# Patient Record
Sex: Female | Born: 1980 | Race: White | Hispanic: Yes | Marital: Single | Smoking: Light tobacco smoker
Health system: Southern US, Community
[De-identification: ages and names within clinical notes are randomized; demographics above are authoritative.]

## PROBLEM LIST (undated history)

## (undated) ENCOUNTER — Inpatient Hospital Stay (HOSPITAL_COMMUNITY): Payer: Self-pay

## (undated) DIAGNOSIS — B977 Papillomavirus as the cause of diseases classified elsewhere: Secondary | ICD-10-CM

## (undated) HISTORY — PX: INDUCED ABORTION: SHX677

---

## 2002-01-09 ENCOUNTER — Other Ambulatory Visit: Admission: RE | Admit: 2002-01-09 | Discharge: 2002-01-09 | Payer: Self-pay | Admitting: Obstetrics

## 2004-08-01 ENCOUNTER — Other Ambulatory Visit: Admission: RE | Admit: 2004-08-01 | Discharge: 2004-08-01 | Payer: Self-pay | Admitting: Internal Medicine

## 2010-08-18 ENCOUNTER — Inpatient Hospital Stay (HOSPITAL_COMMUNITY): Admission: AD | Admit: 2010-08-18 | Discharge: 2010-08-18 | Payer: Self-pay | Admitting: Obstetrics & Gynecology

## 2012-02-21 ENCOUNTER — Inpatient Hospital Stay (HOSPITAL_COMMUNITY): Payer: Medicaid Other

## 2012-02-21 ENCOUNTER — Inpatient Hospital Stay (HOSPITAL_COMMUNITY)
Admission: AD | Admit: 2012-02-21 | Discharge: 2012-02-21 | Disposition: A | Discharge status: Home / Self Care | Payer: Medicaid Other | Source: Ambulatory Visit | Attending: Obstetrics & Gynecology | Admitting: Obstetrics & Gynecology

## 2012-02-21 ENCOUNTER — Encounter (HOSPITAL_COMMUNITY): Payer: Self-pay | Admitting: *Deleted

## 2012-02-21 DIAGNOSIS — O209 Hemorrhage in early pregnancy, unspecified: Secondary | ICD-10-CM | POA: Insufficient documentation

## 2012-02-21 HISTORY — DX: Papillomavirus as the cause of diseases classified elsewhere: B97.7

## 2012-02-21 LAB — URINALYSIS, ROUTINE W REFLEX MICROSCOPIC
Bilirubin Urine: NEGATIVE
Ketones, ur: NEGATIVE mg/dL
Specific Gravity, Urine: 1.01 (ref 1.005–1.030)
Urobilinogen, UA: 0.2 mg/dL (ref 0.0–1.0)

## 2012-02-21 LAB — ABO/RH: ABO/RH(D): O POS

## 2012-02-21 LAB — URINE MICROSCOPIC-ADD ON

## 2012-02-21 LAB — WET PREP, GENITAL
Trich, Wet Prep: NONE SEEN
Yeast Wet Prep HPF POC: NONE SEEN

## 2012-02-21 NOTE — MAU Note (Signed)
Patient states she has had a positive pregnancy test at [facility]. States she had brownish spotting with wiping after urinating. States a little lower abdominal discomfort.

## 2012-02-21 NOTE — MAU Provider Note (Signed)
[name] [name] y.o.G2P0010 @[redacted]w[redacted]d  by LMP Chief Complaint  Patient presents with  . Vaginal Bleeding     First Provider Initiated Contact with Patient 02/21/12 1355      SUBJECTIVE  HPI: Noted dark brown streaking on toilet paper after wiping today. This is first episode of any vaginal bleeding. She did have intercourse yesterday. No prenatal care yet. Has been attempting to get pregnant for 1-1/2 years. No abdominal pain  OB Hx: early EAB  Past Medical History  Diagnosis Date  . HPV (human papilloma virus) infection    Past Surgical History  Procedure Date  . No past surgeries    History   Social History  . Marital Status: Single    Spouse Name: N/A    Number of Children: N/A  . Years of Education: N/A   Occupational History  . Not on file.   Social History Main Topics  . Smoking status: Former Games developer  . Smokeless tobacco: Not on file  . Alcohol Use: No  . Drug Use: No  . Sexually Active: Yes   Other Topics Concern  . Not on file   Social History Narrative  . No narrative on file   No current facility-administered medications on file prior to encounter.   No current outpatient prescriptions on file prior to encounter.   No Known Allergies  ROS: Pertinent items in HPI  OBJECTIVE Blood pressure 115/67, pulse 58, temperature 97.4 F (36.3 C), temperature source Oral, resp. rate 16, height 5\' 7"  (1.702 m), weight 98.521 kg (217 lb 3.2 oz), last menstrual period 12/12/2011, SpO2 100.00%.  GENERAL: Well-developed, well-nourished female in no acute distress.  HEENT: Normocephalic, good dentition HEART: normal rate RESP: normal effort ABDOMEN: Soft, nontender EXTREMITIES: Nontender, no edema NEURO: Alert and oriented SPECULUM EXAM: NEFG, scant brown blood noted blood noted without any active bleeding or discharge, cervix clean BIMANUAL: cervix long closed; uterus 8-10 wk size; no adnexal tenderness or masses   LAB RESULTS  Results for orders placed  during the hospital encounter of 02/21/12 (from the past 24 hour(s))  URINALYSIS, ROUTINE W REFLEX MICROSCOPIC     Status: Abnormal   Collection Time   02/21/12  1:00 PM      Component Value Range   Color, Urine YELLOW  YELLOW    APPearance CLEAR  CLEAR    Specific Gravity, Urine 1.010  1.005 - 1.030    pH 7.0  5.0 - 8.0    Glucose, UA NEGATIVE  NEGATIVE (mg/dL)   Hgb urine dipstick MODERATE (*) NEGATIVE    Bilirubin Urine NEGATIVE  NEGATIVE    Ketones, ur NEGATIVE  NEGATIVE (mg/dL)   Protein, ur NEGATIVE  NEGATIVE (mg/dL)   Urobilinogen, UA 0.2  0.0 - 1.0 (mg/dL)   Nitrite NEGATIVE  NEGATIVE    Leukocytes, UA NEGATIVE  NEGATIVE   URINE MICROSCOPIC-ADD ON     Status: Abnormal   Collection Time   02/21/12  1:00 PM      Component Value Range   Squamous Epithelial / LPF FEW (*) RARE    RBC / HPF 0-2  <3 (RBC/hpf)   Bacteria, UA RARE  RARE   WET PREP, GENITAL     Status: Abnormal   Collection Time   02/21/12  1:00 PM      Component Value Range   Yeast Wet Prep HPF POC NONE SEEN  NONE SEEN    Trich, Wet Prep NONE SEEN  NONE SEEN    Clue Cells Wet Prep  HPF POC NONE SEEN  NONE SEEN    WBC, Wet Prep HPF POC FEW (*) NONE SEEN   POCT PREGNANCY, URINE     Status: Abnormal   Collection Time   02/21/12  1:21 PM      Component Value Range   Preg Test, Ur POSITIVE (*) NEGATIVE   ABO/RH     Status: Normal   Collection Time   02/21/12  2:01 PM      Component Value Range   ABO/RH(D) O POS      IMAGING Bedside ultrasound by me confirms viable early IUP [place]: viable IUP 945d, plac and adnexae nl  ASSESSMENT Viable IUP Early pregnancy bleeding  PLAN   followup health Department. Prenatal vitamins 1 by mouth daily. Information on  bleeding in pregnancy and on pregnancy precautions.   Gaspar Fowle 02/21/2012 1:56 PM

## 2012-03-11 ENCOUNTER — Encounter (HOSPITAL_COMMUNITY): Payer: Self-pay | Admitting: *Deleted

## 2012-03-11 ENCOUNTER — Inpatient Hospital Stay (HOSPITAL_COMMUNITY)
Admission: AD | Admit: 2012-03-11 | Discharge: 2012-03-11 | Disposition: A | Discharge status: Home / Self Care | Payer: Medicaid Other | Source: Ambulatory Visit | Attending: Obstetrics & Gynecology | Admitting: Obstetrics & Gynecology

## 2012-03-11 DIAGNOSIS — O99891 Other specified diseases and conditions complicating pregnancy: Secondary | ICD-10-CM | POA: Insufficient documentation

## 2012-03-11 DIAGNOSIS — R109 Unspecified abdominal pain: Secondary | ICD-10-CM | POA: Insufficient documentation

## 2012-03-11 DIAGNOSIS — N949 Unspecified condition associated with female genital organs and menstrual cycle: Secondary | ICD-10-CM

## 2012-03-11 LAB — URINALYSIS, ROUTINE W REFLEX MICROSCOPIC
Glucose, UA: NEGATIVE mg/dL
Specific Gravity, Urine: >1.03 — ABNORMAL HIGH (ref 1.005–1.030)

## 2012-03-11 LAB — URINE MICROSCOPIC-ADD ON

## 2012-03-11 NOTE — MAU Provider Note (Signed)
[name] [name] y.o.G2P0010 @[redacted]w[redacted]d  by LMP Chief Complaint  Patient presents with  . Abdominal Pain     First Provider Initiated Contact with Patient 03/11/12 1715      SUBJECTIVE  HPI: Pt reports short, sharp groin pains w/ mvmt and loss of breast tenderness in the past few days. She denies LOF, vaginal discharge or VB. She has not started prenatal care yet due to waiting for Medicaid and is very nervous about not having start prenatal care yet. GC/CT, Wet prep neg two weeks ago in MAU.    Past Medical History  Diagnosis Date  . HPV (human papilloma virus) infection    Past Surgical History  Procedure Date  . Induced abortion    History   Social History  . Marital Status: Single    Spouse Name: N/A    Number of Children: N/A  . Years of Education: N/A   Occupational History  . Not on file.   Social History Main Topics  . Smoking status: Former Smoker    Types: Cigarettes  . Smokeless tobacco: Not on file   Comment: with preg  . Alcohol Use: Yes     none with preg  . Drug Use: No  . Sexually Active: Yes   Other Topics Concern  . Not on file   Social History Narrative  . No narrative on file   No current facility-administered medications on file prior to encounter.   Current Outpatient Prescriptions on File Prior to Encounter  Medication Sig Dispense Refill  . Prenatal Vit-Fe Fumarate-FA (PRENATAL MULTIVITAMIN) TABS Take 1 tablet by mouth daily.       No Known Allergies  ROS: Pertinent items in HPI  OBJECTIVE Blood pressure 112/64, pulse 69, temperature 99 F (37.2 C), temperature source Oral, resp. rate 20, height 5\' 7"  (1.702 m), weight 98.884 kg (218 lb), last menstrual period 12/12/2011, SpO2 100.00%.  GENERAL: Well-developed, well-nourished female in no acute distress. Nervous-appearing. HEENT: Normocephalic, good dentition HEART: normal rate RESP: normal effort ABDOMEN: Soft, nontender EXTREMITIES: Nontender, no edema NEURO: Alert and  oriented BIMANUAL: cervix long and closed; uterus NSSP; no adnexal tenderness or masses FHR: 158  LAB RESULTS Results for orders placed during the hospital encounter of 03/11/12 (from the past 24 hour(s))  URINALYSIS, ROUTINE W REFLEX MICROSCOPIC     Status: Abnormal   Collection Time   03/11/12  2:10 PM      Component Value Range   Color, Urine YELLOW  YELLOW    APPearance HAZY (*) CLEAR    Specific Gravity, Urine >1.030 (*) 1.005 - 1.030    pH 6.0  5.0 - 8.0    Glucose, UA NEGATIVE  NEGATIVE (mg/dL)   Hgb urine dipstick TRACE (*) NEGATIVE    Bilirubin Urine NEGATIVE  NEGATIVE    Ketones, ur NEGATIVE  NEGATIVE (mg/dL)   Protein, ur NEGATIVE  NEGATIVE (mg/dL)   Urobilinogen, UA 0.2  0.0 - 1.0 (mg/dL)   Nitrite NEGATIVE  NEGATIVE    Leukocytes, UA NEGATIVE  NEGATIVE   URINE MICROSCOPIC-ADD ON     Status: Abnormal   Collection Time   03/11/12  2:10 PM      Component Value Range   Squamous Epithelial / LPF MANY (*) RARE    RBC / HPF 0-2  <3 (RBC/hpf)   Bacteria, UA RARE  RARE    Crystals CA OXALATE CRYSTALS (*) NEGATIVE    Urine-Other MUCOUS PRESENT     IMAGING NA  Very reassured to hear  FHR and to know that cervix is closed.  ASSESSMENT 1. Round ligament pain    PLAN D/C home Follow-up Information    Please follow up. (start prenatal care)       Follow up with [facility]. (As needed if symptoms worsen)    Contact information:   [address] [place] [address] [phone]        Medication List  As of 03/11/2012  6:02 PM   CONTINUE taking these medications         prenatal multivitamin Tabs          Reassurance given Comfort measures   Dorathy Kinsman 03/11/2012 5:56 PM

## 2012-03-11 NOTE — MAU Note (Signed)
Pain started over the weekend, worse yesterday. No bleeding.

## 2012-03-11 NOTE — MAU Note (Signed)
Patient states she has been having lower abdominal pain a couple of times every 2-3 hours since yesterday. Denies any bleeding or vaginal discharge.

## 2012-03-11 NOTE — MAU Provider Note (Signed)
Attestation of Attending Supervision of Advanced Practitioner: Evaluation and management procedures were performed by the [facility] Fellow/PA/CNM/NP under my supervision and collaboration. Chart reviewed, and agree with management and plan.  Jaynie Collins, M.D. 03/11/2012 6:27 PM

## 2012-03-11 NOTE — MAU Note (Signed)
Breasts do not seem as tender.

## 2012-04-17 ENCOUNTER — Other Ambulatory Visit (HOSPITAL_COMMUNITY): Payer: Self-pay | Admitting: Family

## 2012-04-17 DIAGNOSIS — Z1389 Encounter for screening for other disorder: Secondary | ICD-10-CM

## 2012-05-01 ENCOUNTER — Ambulatory Visit (HOSPITAL_COMMUNITY)
Admission: RE | Admit: 2012-05-01 | Discharge: 2012-05-01 | Disposition: A | Discharge status: Home / Self Care | Payer: Medicaid Other | Source: Ambulatory Visit | Attending: Family | Admitting: Family

## 2012-05-01 DIAGNOSIS — Z363 Encounter for antenatal screening for malformations: Secondary | ICD-10-CM | POA: Insufficient documentation

## 2012-05-01 DIAGNOSIS — O0930 Supervision of pregnancy with insufficient antenatal care, unspecified trimester: Secondary | ICD-10-CM | POA: Insufficient documentation

## 2012-05-01 DIAGNOSIS — O358XX0 Maternal care for other (suspected) fetal abnormality and damage, not applicable or unspecified: Secondary | ICD-10-CM | POA: Insufficient documentation

## 2012-05-01 DIAGNOSIS — Z1389 Encounter for screening for other disorder: Secondary | ICD-10-CM | POA: Insufficient documentation

## 2012-05-23 ENCOUNTER — Inpatient Hospital Stay (HOSPITAL_COMMUNITY)
Admission: AD | Admit: 2012-05-23 | Discharge: 2012-05-27 | DRG: 765 | Disposition: A | Discharge status: Home / Self Care | Payer: Medicaid Other | Source: Ambulatory Visit | Attending: Obstetrics & Gynecology | Admitting: Obstetrics & Gynecology

## 2012-05-23 ENCOUNTER — Inpatient Hospital Stay (HOSPITAL_COMMUNITY): Payer: Medicaid Other

## 2012-05-23 ENCOUNTER — Encounter (HOSPITAL_COMMUNITY): Payer: Self-pay | Admitting: *Deleted

## 2012-05-23 DIAGNOSIS — O9081 Anemia of the puerperium: Secondary | ICD-10-CM | POA: Diagnosis not present

## 2012-05-23 DIAGNOSIS — O4590 Premature separation of placenta, unspecified, unspecified trimester: Principal | ICD-10-CM | POA: Diagnosis present

## 2012-05-23 DIAGNOSIS — N390 Urinary tract infection, site not specified: Secondary | ICD-10-CM | POA: Diagnosis present

## 2012-05-23 DIAGNOSIS — O2390 Unspecified genitourinary tract infection in pregnancy, unspecified trimester: Secondary | ICD-10-CM | POA: Diagnosis present

## 2012-05-23 DIAGNOSIS — O909 Complication of the puerperium, unspecified: Secondary | ICD-10-CM | POA: Diagnosis not present

## 2012-05-23 DIAGNOSIS — O9903 Anemia complicating the puerperium: Secondary | ICD-10-CM | POA: Diagnosis not present

## 2012-05-23 DIAGNOSIS — O321XX0 Maternal care for breech presentation, not applicable or unspecified: Secondary | ICD-10-CM | POA: Diagnosis present

## 2012-05-23 DIAGNOSIS — D62 Acute posthemorrhagic anemia: Secondary | ICD-10-CM | POA: Diagnosis not present

## 2012-05-23 DIAGNOSIS — O902 Hematoma of obstetric wound: Secondary | ICD-10-CM | POA: Diagnosis not present

## 2012-05-23 LAB — DIC (DISSEMINATED INTRAVASCULAR COAGULATION)PANEL
D-Dimer, Quant: 0.87 ug/mL-FEU — ABNORMAL HIGH (ref 0.00–0.48)
Fibrinogen: 677 mg/dL — ABNORMAL HIGH (ref 204–475)
Platelets: 233 10*3/uL (ref 150–400)
Smear Review: NONE SEEN

## 2012-05-23 LAB — URINALYSIS, ROUTINE W REFLEX MICROSCOPIC
Ketones, ur: NEGATIVE mg/dL
Nitrite: NEGATIVE
Protein, ur: NEGATIVE mg/dL

## 2012-05-23 LAB — CBC
HCT: 34.3 % — ABNORMAL LOW (ref 36.0–46.0)
MCV: 84.3 fL (ref 78.0–100.0)
Platelets: 271 10*3/uL (ref 150–400)
RBC: 4.07 MIL/uL (ref 3.87–5.11)
WBC: 17.4 10*3/uL — ABNORMAL HIGH (ref 4.0–10.5)

## 2012-05-23 LAB — URINE MICROSCOPIC-ADD ON

## 2012-05-23 LAB — RPR: RPR Ser Ql: NONREACTIVE

## 2012-05-23 MED ORDER — MAGNESIUM SULFATE BOLUS VIA INFUSION
6.0000 g | Freq: Once | INTRAVENOUS | Status: AC
  Administered 2012-05-23: 6 g via INTRAVENOUS
  Filled 2012-05-23: qty 500

## 2012-05-23 MED ORDER — PENICILLIN G POTASSIUM 5000000 UNITS IJ SOLR
2.5000 10*6.[IU] | INTRAVENOUS | Status: DC
  Filled 2012-05-23 (×3): qty 2.5

## 2012-05-23 MED ORDER — CALCIUM CARBONATE ANTACID 500 MG PO CHEW: 2.0000 | CHEWABLE_TABLET | ORAL | Status: DC | PRN

## 2012-05-23 MED ORDER — DOCUSATE SODIUM 100 MG PO CAPS: 100.0000 mg | ORAL_CAPSULE | Freq: Every day | ORAL | Status: DC

## 2012-05-23 MED ORDER — PENICILLIN G POTASSIUM 5000000 UNITS IJ SOLR
5.0000 10*6.[IU] | Freq: Once | INTRAVENOUS | Status: AC
  Administered 2012-05-23: 5 10*6.[IU] via INTRAVENOUS
  Filled 2012-05-23: qty 5

## 2012-05-23 MED ORDER — PRENATAL MULTIVITAMIN CH: 1.0000 | ORAL_TABLET | Freq: Every day | ORAL | Status: DC

## 2012-05-23 MED ORDER — ACETAMINOPHEN 325 MG PO TABS
650.0000 mg | ORAL_TABLET | ORAL | Status: DC | PRN
  Administered 2012-05-24: 650 mg via ORAL
  Filled 2012-05-23: qty 2

## 2012-05-23 MED ORDER — LACTATED RINGERS IV SOLN
INTRAVENOUS | Status: DC
  Administered 2012-05-23: 10:00:00 via INTRAVENOUS
  Administered 2012-05-23 (×2): 125 mL via INTRAVENOUS
  Administered 2012-05-24: 02:00:00 via INTRAVENOUS

## 2012-05-23 MED ORDER — MAGNESIUM SULFATE 40 G IN LACTATED RINGERS - SIMPLE
2.5000 g/h | INTRAVENOUS | Status: DC
  Administered 2012-05-23 – 2012-05-24 (×2): 2 g/h via INTRAVENOUS
  Filled 2012-05-23 (×2): qty 500

## 2012-05-23 MED ORDER — BETAMETHASONE SOD PHOS & ACET 6 (3-3) MG/ML IJ SUSP
12.0000 mg | Freq: Once | INTRAMUSCULAR | Status: AC
  Administered 2012-05-24: 12 mg via INTRAMUSCULAR
  Filled 2012-05-23: qty 2

## 2012-05-23 MED ORDER — ZOLPIDEM TARTRATE 5 MG PO TABS
5.0000 mg | ORAL_TABLET | Freq: Once | ORAL | Status: AC
Start: 2012-05-23 — End: 2012-05-23
  Administered 2012-05-23: 5 mg via ORAL
  Filled 2012-05-23: qty 1

## 2012-05-23 MED ORDER — BETAMETHASONE SOD PHOS & ACET 6 (3-3) MG/ML IJ SUSP
12.0000 mg | Freq: Once | INTRAMUSCULAR | Status: AC
  Administered 2012-05-23: 12 mg via INTRAMUSCULAR
  Filled 2012-05-23: qty 2

## 2012-05-23 MED ORDER — ZOLPIDEM TARTRATE 5 MG PO TABS
5.0000 mg | ORAL_TABLET | Freq: Every evening | ORAL | Status: DC | PRN
  Administered 2012-05-23: 5 mg via ORAL
  Filled 2012-05-23: qty 1

## 2012-05-23 NOTE — Progress Notes (Signed)
MFM in with patient for consult

## 2012-05-23 NOTE — Consult Note (Signed)
Asked by Dr Tamela Oddi to speak to [name] [name] to discuss 23 wk preterm outcome. Pregnancy is complicated by suspected abruption and PTL at 23 2/7 weeks. US shows breech presentation. No recent US for EFW. [place] on 7/24 showed appropriate growth. She has received a dose of betamethasone and is on magnesium sulfate.  I discussed low rate of survival at this gestation by center experience. I also discussed resuscitation. Discussed most common morbidities, the incidence of which are highest at this gestation such as RDS, prolonged vent/O2 need, GI immaturity, IVH, ROP, and learning/neurodev disabilities.  Mom desires full resuscitation of this baby.  Thank you for inviting [place] to be involved in her care before delivery.  Kathia Covington Q   Face to face time 25 min

## 2012-05-23 NOTE — MAU Note (Signed)
TO  RM 161 VIA STRETCHER - [place]

## 2012-05-23 NOTE — Progress Notes (Signed)
05/23/12 1500  Clinical Encounter Type  Visited With Patient and family together (Mom, aunt, aunt's three children)  Visit Type Initial;Spiritual support;Social support  Recommendations Spiritual Care will follow for spiritual/emotional support.  Spiritual Encounters  Spiritual Needs Emotional;Prayer  Stress Factors  Patient Stress Factors Loss of control (Risk to baby feels so stressful, scary, sad for pt)    Audreana was sad, scared, and tearful during this initial visit.  She's grateful for her mom's support (mom was visiting from [place] when this happened).  Provided opportunity for Adamaris to share and process her feelings, worries, and hopes. Prior to this pregnancy she thought she may not get a chance to have a baby, so being pregnant has been such a source of deep joy and gratitude.  In addition to pastoral listening and reflection, I provided prayer at bedside.  Will refer to Chaplain Dyanne Carrel for follow-up care tomorrow.  Samhitha is aware of ongoing chaplain availability.  [address] [place], [place] [phone]

## 2012-05-23 NOTE — Progress Notes (Signed)
Pt unable to tolerate foley cathetor, removed per pt request.

## 2012-05-23 NOTE — H&P (Signed)
[name] [name] is a 31 y.o. female presenting for vaginal bleeding. Maternal Medical History:  Reason for admission: Reason for admission: contractions and vaginal bleeding.  Contractions: Onset was 6-12 hours ago.   Frequency: regular.   Perceived severity is moderate.    Prenatal complications: no prenatal complications   OB History    Grav Para Term Preterm Abortions TAB SAB Ect Mult Living   2    1 1          Past Medical History  Diagnosis Date  . HPV (human papilloma virus) infection    Past Surgical History  Procedure Date  . Induced abortion    Family History: family history is negative for Anesthesia problems. Social History:  reports that she has quit smoking. Her smoking use included Cigarettes. She does not have any smokeless tobacco history on file. She reports that she drinks alcohol. She reports that she does not use illicit drugs.     Review of Systems  Constitutional: Positive for fever.  Eyes: Negative for blurred vision.  Respiratory: Negative for shortness of breath.   Gastrointestinal: Negative for vomiting.  Genitourinary: Positive for frequency.  Skin: Negative for rash.  Neurological: Negative for headaches.      Blood pressure 115/67, pulse 115, temperature 97.9 F (36.6 C), temperature source Oral, resp. rate 20, height 5\' 7"  (1.702 m), weight 103.08 kg (227 lb 4 oz), last menstrual period 12/12/2011, SpO2 100.00%. Maternal Exam:  Uterine Assessment: Contraction frequency is irregular.   Abdomen: Patient reports no abdominal tenderness. Fetal presentation: breech  Introitus: not evaluated.   Cervix: Cervix evaluated by digital exam.     Physical Exam  Constitutional: She appears well-developed.  HENT:  Head: Normocephalic.  Neck: Neck supple. No thyromegaly present.  Cardiovascular: Normal rate and regular rhythm.   Respiratory: Breath sounds normal.  GI: Soft. Bowel sounds are normal.  Skin: No rash noted.    Prenatal labs: ABO,  Rh: --/--/O POS (08/15 0425) Antibody: NEG (08/15 0425) Rubella:   RPR:    HBsAg:    HIV:    GBS:     Assessment/Plan: 31 y.o. with an IUP @ [redacted]w[redacted]d.  Constellation of signs/symptoms worrisome for an abruption.  Breech presentation. ?UTI   Admit Bedrest/steroids/tocolytic Consults--NICU/MFM/Anesthesiology   JACKSON-MOORE,Lizbett Garciagarcia A 05/23/2012, 9:07 AM

## 2012-05-23 NOTE — MAU Provider Note (Signed)
History     CSN: [id number]  Arrival date and time: 05/23/12 0306   None     No chief complaint on file.  HPI [name] [name] is 31 y.o. G2P0010 [redacted]w[redacted]d weeks presenting with report of feeling like she had a fever last night around 9pm, felt cold.  Then had lower abdominal pain.  Began bleeding at 2:40 this am.  Last sex several days ago.  Now has pressure in her back and lower abdomen.  Began care at health dept and has transferred care to [facility].  Last ultrasound here was 05/01/12--she was [redacted]w[redacted]d--normal with placental above os.      Past Medical History  Diagnosis Date  . HPV (human papilloma virus) infection     Past Surgical History  Procedure Date  . Induced abortion     Family History  Problem Relation Age of Onset  . Anesthesia problems Neg Hx     History  Substance Use Topics  . Smoking status: Former Smoker    Types: Cigarettes  . Smokeless tobacco: Not on file   Comment: with preg  . Alcohol Use: Yes     none with preg    Allergies: No Known Allergies  Prescriptions prior to admission  Medication Sig Dispense Refill  . Prenatal Vit-Fe Fumarate-FA (PRENATAL MULTIVITAMIN) TABS Take 1 tablet by mouth daily.        Review of Systems  Constitutional: Positive for fever and chills.  Gastrointestinal: Positive for abdominal pain (lower).  Genitourinary:       + for vaginal bleeding  Musculoskeletal: Positive for back pain.   Physical Exam   Blood pressure 97/52, pulse 116, temperature 99.1 F (37.3 C), temperature source Oral, resp. rate 20, height 5\' 7"  (1.702 m), weight 103.08 kg (227 lb 4 oz), last menstrual period 12/12/2011.  Physical Exam  Constitutional: She is oriented to person, place, and time. She appears well-developed and well-nourished.       uncomfortable  HENT:  Head: Normocephalic.  Neck: Normal range of motion.  Respiratory: Effort normal.  Genitourinary:       Gentle speculum exam revealed moderate amount of blood in vagina with  clot. Speculum removed.  ? Membranes seen.  Internal exam was not performed  Neurological: She is alert and oriented to person, place, and time.  Skin: Skin is dry.  Psychiatric: She has a normal mood and affect. Her behavior is normal. Thought content normal.   Clinical Data: Bleeding.  LIMITED OBSTETRIC ULTRASOUND  Number of Fetuses: 1  Heart Rate: 155 bpm  Movement: Present  Presentation: Breech  Placental Location: Posterior  Previa: Negative  Amniotic Fluid (Subjective): Subjectively normal.  Vertical Pocket 3.9 cm  MATERNAL FINDINGS:  Cervix: The cervix is dilated to 0.9 cm. Bulging membranes extend  into the vagina.  Uterus/Adnexae: Within normal limits.  IMPRESSION:  1. Cervix dilated to 2.9 cm with membranes bulging into the  vagina.  2. Breech presentation with fetal heart rate of 155.  3. Subjectively normal amniotic fluid volume.  Recommend followup with non-emergent complete OB 14+ wk US  examination for fetal biometric evaluation and anatomic survey if  not already performed.  Original Report Authenticated By: Andreas Newport, M.D.    BLOOD TYPE PER PREVIOUS RECORD  O+ MAU Course  Procedures  MDM Ultrasound ordered.    4:20 Reported physical exam findings and bedside ultrasound findings.--footling breech with cervix 4cm dimension, no measurable length, membranes intact,--formal report pending.  Orders given to admit to L&D with admit  orders  Assessment and Plan  A:  Vaginal bleeding     Premature dilatation of the cervix at [redacted]w[redacted]d   P:  Admit to L&D  Rey Fors,EVE M 05/23/2012, 3:48 AM

## 2012-05-23 NOTE — MAU Note (Signed)
US AT BEDSIDE

## 2012-05-23 NOTE — Progress Notes (Signed)
Jackson-Moore at bedside. Strongly suggest pt allow a foley to be placed so pt isn't lifting up and down to be placed on bedpan. Pt uncertain and is thinking about it.  POC discussed with pt.

## 2012-05-23 NOTE — Consult Note (Signed)
MATERNAL FETAL MEDICINE CONSULT  Patient Name: [name] [name] Medical Record Number:  [id number] Date of Birth: 08-23-1981 Requesting Physician Name:  Antionette Char, MD Date of Service: 05/23/2012  Chief Complaint Placental abruption/preterm labor  History of Present Illness [name] [name] was seen today secondary to vaginal bleeding and possible abruption with preterm labor at the request of Antionette Char, MD.  The patient is a 31 y.o. G2P0010,at [redacted]w[redacted]d with an EDD of 09/17/2012, by Last Menstrual Period dating method.  At approximately 2:30 am today she began experiencing vaginal bleeding and abdominal pain which prompted her to come to the MAU.  Evaluation at that time confirmed vaginal bleeding and also noted her cervix to be approximately 3 cm dilated.  She is receiving magnesium and had her first dose of betamethasone at approximately 4 am this morning.  Review of Systems Pertinent items are noted in HPI.  Patient History OB History    Grav Para Term Preterm Abortions TAB SAB Ect Mult Living   2    1 1          # Outc Date GA Lbr Len/2nd Wgt Sex Del Anes PTL Lv   1 TAB            2 CUR               Past Medical History  Diagnosis Date  . HPV (human papilloma virus) infection     Past Surgical History  Procedure Date  . Induced abortion     History   Social History  . Marital Status: Single    Spouse Name: N/A    Number of Children: N/A  . Years of Education: N/A   Social History Main Topics  . Smoking status: Former Smoker    Types: Cigarettes  . Smokeless tobacco: None   Comment: with preg  . Alcohol Use: Yes     none with preg  . Drug Use: No  . Sexually Active: Yes   Other Topics Concern  . None   Social History Narrative  . None    Family History  Problem Relation Age of Onset  . Anesthesia problems Neg Hx    In addition, the patient has no family history of mental retardation, birth defects, or genetic diseases.  Physical  Examination Filed Vitals:   05/23/12 1102  BP: 104/93  Pulse: 111  Temp:   Resp:    General appearance - alert, well appearing, and in no distress Abdomen - soft, nontender, nondistended, no masses or organomegaly Extremities - peripheral pulses normal, no pedal edema, no clubbing or cyanosis  Assessment and Recommendations 1.  Abruption/preterm labor.  Ms. Charlson is in a very difficult situation.  Although it is encouraging that the patient reports a subjective improvement in her contractions, there is a very high risk of labor resuming resulting in delivery in the short term.  At [redacted]w[redacted]d she is at the cusp of viability, as such there is no clear guidelines for how to best manage this situation.  Possibilities include very aggressive management with classical cesarean section (which carries and increased risk of uterine rupture in future pregnancies) should labor resume or fetal distress develop followed by heroic neonatal intervention or more conservative management forgoing fetal monitoring and allowing for a vaginal delivery if labor should restarting knowing there is an increased risk of stillbirth with a vaginal delivery in this situation.  These options were reviewed in detail with Ms. Gane.  She wishes to speak with the  NICU so she can better weight the pros and cons of both approaches before deciding upon a plan of care.  Rema Fendt, MD

## 2012-05-23 NOTE — Progress Notes (Signed)
Pt transferred in bed to antenatal unit room 153

## 2012-05-23 NOTE — MAU Note (Signed)
PT SAYS  THAT HER BACK HURTS - FELT LIKE HAD FEVER AT 10PM- DID NOT TAKE TEMP.  THEN WRAPPED SELF- THEN  AT - FELT LITTLE PAINS IN LOWER ABD- THEN PASSED GAS.- FELT BETTER.  VAG BLEEDING STARTED AT 0240- HAD SEX  ON   Tuesday.     WAS GOING TO [facility]-  SENT HERE FOR U/S.   SWITCHED TO  FAMINA-  .  NEXT APPOINTMENT 8-19.

## 2012-05-24 ENCOUNTER — Inpatient Hospital Stay (HOSPITAL_COMMUNITY): Payer: Medicaid Other | Admitting: Anesthesiology

## 2012-05-24 ENCOUNTER — Encounter (HOSPITAL_COMMUNITY): Payer: Self-pay | Admitting: Anesthesiology

## 2012-05-24 ENCOUNTER — Encounter (HOSPITAL_COMMUNITY)
Admission: AD | Disposition: A | Discharge status: Home / Self Care | Payer: Self-pay | Source: Ambulatory Visit | Attending: Obstetrics & Gynecology

## 2012-05-24 ENCOUNTER — Inpatient Hospital Stay (HOSPITAL_COMMUNITY): Payer: Medicaid Other

## 2012-05-24 ENCOUNTER — Encounter (HOSPITAL_COMMUNITY): Payer: Self-pay

## 2012-05-24 LAB — MAGNESIUM: Magnesium: 4.2 mg/dL — ABNORMAL HIGH (ref 1.5–2.5)

## 2012-05-24 LAB — CBC
HCT: 33.3 % — ABNORMAL LOW (ref 36.0–46.0)
Hemoglobin: 11.1 g/dL — ABNORMAL LOW (ref 12.0–15.0)
MCH: 28.4 pg (ref 26.0–34.0)
MCHC: 33.3 g/dL (ref 30.0–36.0)
MCV: 85.2 fL (ref 78.0–100.0)
Platelets: 295 10*3/uL (ref 150–400)
RBC: 3.91 MIL/uL (ref 3.87–5.11)
RDW: 13.8 % (ref 11.5–15.5)
WBC: 18.1 10*3/uL — ABNORMAL HIGH (ref 4.0–10.5)

## 2012-05-24 LAB — URINE CULTURE: Colony Count: NO GROWTH

## 2012-05-24 LAB — DIC (DISSEMINATED INTRAVASCULAR COAGULATION)PANEL
D-Dimer, Quant: 0.93 ug/mL-FEU — ABNORMAL HIGH (ref 0.00–0.48)
Fibrinogen: 668 mg/dL — ABNORMAL HIGH (ref 204–475)
INR: 0.93 (ref 0.00–1.49)
Platelets: 293 10*3/uL (ref 150–400)
Prothrombin Time: 12.7 seconds (ref 11.6–15.2)

## 2012-05-24 SURGERY — Surgical Case
Anesthesia: Spinal | Site: Abdomen | Wound class: Clean Contaminated

## 2012-05-24 MED ORDER — DIBUCAINE 1 % RE OINT: 1.0000 "application " | TOPICAL_OINTMENT | RECTAL | Status: DC | PRN

## 2012-05-24 MED ORDER — TETANUS-DIPHTH-ACELL PERTUSSIS 5-2.5-18.5 LF-MCG/0.5 IM SUSP
0.5000 mL | Freq: Once | INTRAMUSCULAR | Status: AC
  Administered 2012-05-25: 0.5 mL via INTRAMUSCULAR
  Filled 2012-05-24: qty 0.5

## 2012-05-24 MED ORDER — HYDROMORPHONE HCL PF 1 MG/ML IJ SOLN: 2.0000 mg | Freq: Once | INTRAMUSCULAR | Status: DC

## 2012-05-24 MED ORDER — SODIUM CHLORIDE 0.9 % IJ SOLN: 3.0000 mL | INTRAMUSCULAR | Status: DC | PRN

## 2012-05-24 MED ORDER — CEFAZOLIN SODIUM-DEXTROSE 2-3 GM-% IV SOLR
2.0000 g | Freq: Once | INTRAVENOUS | Status: AC
  Administered 2012-05-24: 2 g via INTRAVENOUS
  Filled 2012-05-24: qty 50

## 2012-05-24 MED ORDER — PHENYLEPHRINE 40 MCG/ML (10ML) SYRINGE FOR IV PUSH (FOR BLOOD PRESSURE SUPPORT)
PREFILLED_SYRINGE | INTRAVENOUS | Status: AC
  Filled 2012-05-24: qty 20

## 2012-05-24 MED ORDER — KETOROLAC TROMETHAMINE 60 MG/2ML IM SOLN
60.0000 mg | Freq: Once | INTRAMUSCULAR | Status: AC | PRN
  Administered 2012-05-24: 60 mg via INTRAMUSCULAR

## 2012-05-24 MED ORDER — DIPHENHYDRAMINE HCL 50 MG/ML IJ SOLN
INTRAMUSCULAR | Status: AC
  Filled 2012-05-24: qty 1

## 2012-05-24 MED ORDER — EPHEDRINE 5 MG/ML INJ
INTRAVENOUS | Status: AC
  Filled 2012-05-24: qty 10

## 2012-05-24 MED ORDER — EPHEDRINE SULFATE 50 MG/ML IJ SOLN
INTRAMUSCULAR | Status: DC | PRN
  Administered 2012-05-24 (×2): 5 mg via INTRAVENOUS

## 2012-05-24 MED ORDER — SODIUM CHLORIDE 0.9 % IV BOLUS (SEPSIS): 1000.0000 mL | Freq: Once | INTRAVENOUS | Status: DC

## 2012-05-24 MED ORDER — OXYTOCIN 40 UNITS IN LACTATED RINGERS INFUSION - SIMPLE MED: 62.5000 mL/h | INTRAVENOUS | Status: AC

## 2012-05-24 MED ORDER — MORPHINE SULFATE (PF) 0.5 MG/ML IJ SOLN
INTRAMUSCULAR | Status: DC | PRN
  Administered 2012-05-24: .15 mg via INTRATHECAL

## 2012-05-24 MED ORDER — DIPHENHYDRAMINE HCL 50 MG/ML IJ SOLN: 25.0000 mg | INTRAMUSCULAR | Status: DC | PRN

## 2012-05-24 MED ORDER — SIMETHICONE 80 MG PO CHEW
80.0000 mg | CHEWABLE_TABLET | Freq: Three times a day (TID) | ORAL | Status: DC
  Administered 2012-05-25 – 2012-05-27 (×9): 80 mg via ORAL

## 2012-05-24 MED ORDER — MIDAZOLAM HCL 5 MG/ML IJ SOLN
INTRAMUSCULAR | Status: DC | PRN
  Administered 2012-05-24: 2 mg via INTRAVENOUS

## 2012-05-24 MED ORDER — OXYCODONE-ACETAMINOPHEN 5-325 MG PO TABS
1.0000 | ORAL_TABLET | ORAL | Status: DC | PRN
  Administered 2012-05-24 – 2012-05-26 (×6): 1 via ORAL
  Administered 2012-05-26 – 2012-05-27 (×2): 2 via ORAL
  Filled 2012-05-24: qty 2
  Filled 2012-05-24 (×5): qty 1
  Filled 2012-05-24: qty 2
  Filled 2012-05-24 (×2): qty 1

## 2012-05-24 MED ORDER — ONDANSETRON HCL 4 MG/2ML IJ SOLN
INTRAMUSCULAR | Status: DC | PRN
  Administered 2012-05-24: 4 mg via INTRAVENOUS

## 2012-05-24 MED ORDER — WITCH HAZEL-GLYCERIN EX PADS: 1.0000 "application " | MEDICATED_PAD | CUTANEOUS | Status: DC | PRN

## 2012-05-24 MED ORDER — KETOROLAC TROMETHAMINE 60 MG/2ML IM SOLN
INTRAMUSCULAR | Status: AC
  Administered 2012-05-24: 60 mg via INTRAMUSCULAR
  Filled 2012-05-24: qty 2

## 2012-05-24 MED ORDER — IBUPROFEN 600 MG PO TABS
600.0000 mg | ORAL_TABLET | Freq: Four times a day (QID) | ORAL | Status: DC
  Administered 2012-05-24 – 2012-05-27 (×10): 600 mg via ORAL

## 2012-05-24 MED ORDER — FENTANYL CITRATE 0.05 MG/ML IJ SOLN
INTRAMUSCULAR | Status: DC | PRN
  Administered 2012-05-24: 25 ug via INTRATHECAL

## 2012-05-24 MED ORDER — LACTATED RINGERS IV SOLN
INTRAVENOUS | Status: DC
  Administered 2012-05-24 – 2012-05-25 (×2): via INTRAVENOUS

## 2012-05-24 MED ORDER — SIMETHICONE 80 MG PO CHEW: 80.0000 mg | CHEWABLE_TABLET | ORAL | Status: DC | PRN

## 2012-05-24 MED ORDER — SCOPOLAMINE 1 MG/3DAYS TD PT72
1.0000 | MEDICATED_PATCH | Freq: Once | TRANSDERMAL | Status: AC
  Administered 2012-05-24: 1.5 mg via TRANSDERMAL

## 2012-05-24 MED ORDER — LANOLIN HYDROUS EX OINT: 1.0000 "application " | TOPICAL_OINTMENT | CUTANEOUS | Status: DC | PRN

## 2012-05-24 MED ORDER — IBUPROFEN 600 MG PO TABS
600.0000 mg | ORAL_TABLET | Freq: Four times a day (QID) | ORAL | Status: DC | PRN
  Filled 2012-05-24 (×10): qty 1

## 2012-05-24 MED ORDER — PHENYLEPHRINE HCL 10 MG/ML IJ SOLN
INTRAMUSCULAR | Status: DC | PRN
  Administered 2012-05-24: 80 ug via INTRAVENOUS
  Administered 2012-05-24 (×2): 40 ug via INTRAVENOUS
  Administered 2012-05-24 (×3): 80 ug via INTRAVENOUS
  Administered 2012-05-24: 120 ug via INTRAVENOUS
  Administered 2012-05-24 (×2): 80 ug via INTRAVENOUS

## 2012-05-24 MED ORDER — NALBUPHINE HCL 10 MG/ML IJ SOLN
5.0000 mg | INTRAMUSCULAR | Status: DC | PRN
  Filled 2012-05-24: qty 1

## 2012-05-24 MED ORDER — MENTHOL 3 MG MT LOZG: 1.0000 | LOZENGE | OROMUCOSAL | Status: DC | PRN

## 2012-05-24 MED ORDER — NALOXONE HCL 0.4 MG/ML IJ SOLN: 0.4000 mg | INTRAMUSCULAR | Status: DC | PRN

## 2012-05-24 MED ORDER — DIPHENHYDRAMINE HCL 50 MG/ML IJ SOLN: 12.5000 mg | INTRAMUSCULAR | Status: DC | PRN

## 2012-05-24 MED ORDER — KETOROLAC TROMETHAMINE 30 MG/ML IJ SOLN
30.0000 mg | Freq: Four times a day (QID) | INTRAMUSCULAR | Status: AC | PRN
  Administered 2012-05-24: 30 mg via INTRAVENOUS
  Filled 2012-05-24: qty 1

## 2012-05-24 MED ORDER — ONDANSETRON HCL 4 MG/2ML IJ SOLN
4.0000 mg | INTRAMUSCULAR | Status: DC | PRN
  Administered 2012-05-24: 4 mg via INTRAVENOUS

## 2012-05-24 MED ORDER — METOCLOPRAMIDE HCL 5 MG/ML IJ SOLN: 10.0000 mg | Freq: Three times a day (TID) | INTRAMUSCULAR | Status: DC | PRN

## 2012-05-24 MED ORDER — DIPHENHYDRAMINE HCL 50 MG/ML IJ SOLN
INTRAMUSCULAR | Status: DC | PRN
  Administered 2012-05-24: 25 mg via INTRAVENOUS

## 2012-05-24 MED ORDER — ZOLPIDEM TARTRATE 5 MG PO TABS
5.0000 mg | ORAL_TABLET | Freq: Every evening | ORAL | Status: DC | PRN
  Administered 2012-05-24: 5 mg via ORAL
  Filled 2012-05-24 (×2): qty 1

## 2012-05-24 MED ORDER — ONDANSETRON HCL 4 MG PO TABS: 4.0000 mg | ORAL_TABLET | ORAL | Status: DC | PRN

## 2012-05-24 MED ORDER — OXYTOCIN 40 UNITS IN LACTATED RINGERS INFUSION - SIMPLE MED
INTRAVENOUS | Status: DC | PRN
  Administered 2012-05-24: 40 [IU] via INTRAVENOUS

## 2012-05-24 MED ORDER — ONDANSETRON HCL 4 MG/2ML IJ SOLN
4.0000 mg | Freq: Three times a day (TID) | INTRAMUSCULAR | Status: DC | PRN
  Filled 2012-05-24: qty 2

## 2012-05-24 MED ORDER — FENTANYL CITRATE 0.05 MG/ML IJ SOLN: 25.0000 ug | INTRAMUSCULAR | Status: DC | PRN

## 2012-05-24 MED ORDER — MEDROXYPROGESTERONE ACETATE 150 MG/ML IM SUSP: 150.0000 mg | INTRAMUSCULAR | Status: DC | PRN

## 2012-05-24 MED ORDER — MORPHINE SULFATE 0.5 MG/ML IJ SOLN
INTRAMUSCULAR | Status: AC
  Filled 2012-05-24: qty 10

## 2012-05-24 MED ORDER — SODIUM CHLORIDE 0.9 % IV SOLN
1.0000 ug/kg/h | INTRAVENOUS | Status: DC | PRN
  Filled 2012-05-24: qty 2.5

## 2012-05-24 MED ORDER — BUPIVACAINE IN DEXTROSE 0.75-8.25 % IT SOLN
INTRATHECAL | Status: DC | PRN
  Administered 2012-05-24: 1.7 mL via INTRATHECAL

## 2012-05-24 MED ORDER — DIPHENHYDRAMINE HCL 25 MG PO CAPS: 25.0000 mg | ORAL_CAPSULE | Freq: Four times a day (QID) | ORAL | Status: DC | PRN

## 2012-05-24 MED ORDER — MEPERIDINE HCL 25 MG/ML IJ SOLN
6.2500 mg | INTRAMUSCULAR | Status: DC | PRN
  Administered 2012-05-24: 6.25 mg via INTRAVENOUS

## 2012-05-24 MED ORDER — LACTATED RINGERS IV SOLN
INTRAVENOUS | Status: DC | PRN
  Administered 2012-05-24 (×3): via INTRAVENOUS

## 2012-05-24 MED ORDER — KETOROLAC TROMETHAMINE 30 MG/ML IJ SOLN: 30.0000 mg | Freq: Four times a day (QID) | INTRAMUSCULAR | Status: AC | PRN

## 2012-05-24 MED ORDER — CEFAZOLIN SODIUM-DEXTROSE 2-3 GM-% IV SOLR
INTRAVENOUS | Status: AC
  Filled 2012-05-24: qty 50

## 2012-05-24 MED ORDER — PRENATAL MULTIVITAMIN CH
1.0000 | ORAL_TABLET | Freq: Every day | ORAL | Status: DC
  Administered 2012-05-25 – 2012-05-27 (×3): 1 via ORAL
  Filled 2012-05-24 (×3): qty 1

## 2012-05-24 MED ORDER — DIPHENHYDRAMINE HCL 25 MG PO CAPS: 25.0000 mg | ORAL_CAPSULE | ORAL | Status: DC | PRN

## 2012-05-24 MED ORDER — MIDAZOLAM HCL 2 MG/2ML IJ SOLN
INTRAMUSCULAR | Status: AC
  Filled 2012-05-24: qty 2

## 2012-05-24 MED ORDER — MEPERIDINE HCL 25 MG/ML IJ SOLN
INTRAMUSCULAR | Status: AC
  Administered 2012-05-24: 6.25 mg via INTRAVENOUS
  Filled 2012-05-24: qty 1

## 2012-05-24 MED ORDER — PHENYLEPHRINE HCL 10 MG/ML IJ SOLN: INTRAMUSCULAR | Status: DC | PRN

## 2012-05-24 MED ORDER — FENTANYL CITRATE 0.05 MG/ML IJ SOLN
INTRAMUSCULAR | Status: AC
  Filled 2012-05-24: qty 5

## 2012-05-24 MED ORDER — SCOPOLAMINE 1 MG/3DAYS TD PT72
MEDICATED_PATCH | TRANSDERMAL | Status: AC
  Filled 2012-05-24: qty 1

## 2012-05-24 MED ORDER — SENNOSIDES-DOCUSATE SODIUM 8.6-50 MG PO TABS
2.0000 | ORAL_TABLET | Freq: Every day | ORAL | Status: DC
  Administered 2012-05-25 – 2012-05-26 (×2): 2 via ORAL

## 2012-05-24 MED ORDER — CITRIC ACID-SODIUM CITRATE 334-500 MG/5ML PO SOLN
ORAL | Status: AC
  Administered 2012-05-24: 30 mL
  Filled 2012-05-24: qty 15

## 2012-05-24 SURGICAL SUPPLY — 40 items
APL SKNCLS STERI-STRIP NONHPOA (GAUZE/BANDAGES/DRESSINGS) ×1
BENZOIN TINCTURE PRP APPL 2/3 (GAUZE/BANDAGES/DRESSINGS) ×2 IMPLANT
BRR ADH 6X5 SEPRAFILM 1 SHT (MISCELLANEOUS) ×1
CANISTER WOUND CARE 500ML ATS (WOUND CARE) IMPLANT
CHLORAPREP W/TINT 26ML (MISCELLANEOUS) ×2 IMPLANT
CLOTH BEACON ORANGE TIMEOUT ST (SAFETY) ×2 IMPLANT
CONTAINER PREFILL 10% NBF 15ML (MISCELLANEOUS) IMPLANT
DRESSING TELFA 8X3 (GAUZE/BANDAGES/DRESSINGS) ×2 IMPLANT
DRSG COVADERM 4X10 (GAUZE/BANDAGES/DRESSINGS) ×1 IMPLANT
DRSG VAC ATS LRG SENSATRAC (GAUZE/BANDAGES/DRESSINGS) IMPLANT
DRSG VAC ATS MED SENSATRAC (GAUZE/BANDAGES/DRESSINGS) IMPLANT
DRSG VAC ATS SM SENSATRAC (GAUZE/BANDAGES/DRESSINGS) IMPLANT
ELECT REM PT RETURN 9FT ADLT (ELECTROSURGICAL) ×2
ELECTRODE REM PT RTRN 9FT ADLT (ELECTROSURGICAL) ×1 IMPLANT
GAUZE SPONGE 4X4 12PLY STRL LF (GAUZE/BANDAGES/DRESSINGS) ×3 IMPLANT
GLOVE BIO SURGEON STRL SZ 6.5 (GLOVE) ×4 IMPLANT
GOWN PREVENTION PLUS LG XLONG (DISPOSABLE) ×8 IMPLANT
KIT ABG SYR 3ML LUER SLIP (SYRINGE) ×1 IMPLANT
NDL HYPO 25X5/8 SAFETYGLIDE (NEEDLE) ×1 IMPLANT
NEEDLE HYPO 25X5/8 SAFETYGLIDE (NEEDLE) ×2 IMPLANT
NS IRRIG 1000ML POUR BTL (IV SOLUTION) ×3 IMPLANT
PACK C SECTION WH (CUSTOM PROCEDURE TRAY) ×2 IMPLANT
PAD ABD 7.5X8 STRL (GAUZE/BANDAGES/DRESSINGS) ×2 IMPLANT
SEPRAFILM MEMBRANE 5X6 (MISCELLANEOUS) ×1 IMPLANT
SLEEVE SCD COMPRESS KNEE MED (MISCELLANEOUS) ×1 IMPLANT
STAPLER VISISTAT 35W (STAPLE) IMPLANT
STRIP CLOSURE SKIN 1/2X4 (GAUZE/BANDAGES/DRESSINGS) ×2 IMPLANT
SUT MNCRL 0 VIOLET CTX 36 (SUTURE) ×2 IMPLANT
SUT MNCRL AB 3-0 PS2 27 (SUTURE) IMPLANT
SUT MONOCRYL 0 CTX 36 (SUTURE) ×7
SUT PDS AB 0 CTX 36 PDP370T (SUTURE) ×2 IMPLANT
SUT VIC AB 0 CTXB 36 (SUTURE) IMPLANT
SUT VIC AB 2-0 CT1 (SUTURE) IMPLANT
SUT VIC AB 2-0 CT1 27 (SUTURE) ×2
SUT VIC AB 2-0 CT1 TAPERPNT 27 (SUTURE) ×1 IMPLANT
SUT VIC AB 2-0 SH 27 (SUTURE)
SUT VIC AB 2-0 SH 27XBRD (SUTURE) IMPLANT
TAPE CLOTH SURG 4X10 WHT LF (GAUZE/BANDAGES/DRESSINGS) ×1 IMPLANT
TOWEL OR 17X24 6PK STRL BLUE (TOWEL DISPOSABLE) ×4 IMPLANT
TRAY FOLEY CATH 14FR (SET/KITS/TRAYS/PACK) ×2 IMPLANT

## 2012-05-24 NOTE — Progress Notes (Signed)
This was a follow-up visit with [name] [name] who was seen yesterday by Caro Hight.  She is feeling sad and anxious after delivering baby [name] at 23 weeks.  She has a history of loss and she and her husband have had a long journey of trying to have a child.  She has a lot of family support including her husband, mother, aunts and other relatives.  We shared a prayer for baby Maureen Ralphs and I provided pastoral presence and compassionate listening.  We will continue to follow this family as we are able, please page as needs arise or as family requests, [phone].  Chaplain Dyanne Carrel 12:19 PM   05/24/12 1200  Clinical Encounter Type  Visited With Patient and family together  Visit Type Spiritual support;Follow-up  Spiritual Encounters  Spiritual Needs Prayer;Emotional  Stress Factors  Patient Stress Factors (Premature baby in NICU.)

## 2012-05-24 NOTE — Anesthesia Procedure Notes (Signed)
Spinal  Patient location during procedure: OR Start time: 05/24/2012 7:34 AM Staffing Anesthesiologist: Errin Whitelaw A. Preanesthetic Checklist Completed: patient identified, site marked, surgical consent, pre-op evaluation, timeout performed, IV checked, risks and benefits discussed and monitors and equipment checked Spinal Block Patient position: right lateral decubitus Prep: site prepped and draped and DuraPrep Patient monitoring: heart rate, cardiac monitor, continuous pulse ox and blood pressure Approach: midline Location: L3-4 Injection technique: single-shot Needle Needle type: Sprotte  Needle gauge: 24 G Needle length: 9 cm Needle insertion depth: 7 cm Assessment Sensory level: T3 Additional Notes Patient tolerated procedure well. Adequate sensory level.

## 2012-05-24 NOTE — Anesthesia Postprocedure Evaluation (Signed)
  Anesthesia Post-op Note  Patient: [name] [name]  Procedure(s) Performed: Procedure(s) (LRB): CESAREAN SECTION (N/A)  Patient is awake, responsive, moving her legs, and has signs of resolution of her numbness. Pain and nausea are reasonably well controlled. Vital signs are stable and clinically acceptable. Oxygen saturation is clinically acceptable. There are no apparent anesthetic complications at this time. Patient is ready for discharge.

## 2012-05-24 NOTE — Addendum Note (Signed)
Addendum  created 05/24/12 1529 by Renford Dills, CRNA   Modules edited:Notes Section

## 2012-05-24 NOTE — Anesthesia Postprocedure Evaluation (Signed)
  Anesthesia Post-op Note  Patient: [name] [name]  Procedure(s) Performed: Procedure(s) (LRB): CESAREAN SECTION (N/A)  Patient Location: Women's Unit  Anesthesia Type: Spinal  Level of Consciousness: awake  Airway and Oxygen Therapy: Patient Spontanous Breathing  Post-op Pain: mild  Post-op Assessment: Patient's Cardiovascular Status Stable and Respiratory Function Stable  Post-op Vital Signs: stable  Complications: No apparent anesthesia complications

## 2012-05-24 NOTE — Progress Notes (Signed)
Patient ID: [name] [name], female   DOB: [birth year], 31 y.o.   MRN: [id number] Pt w/worsening contractions/resumption of PVB.  VSS FHR: 140s by doppler  SVE: per RN: no palpable cx/membranes bulging in the vagina  A/P:  IUP @ [redacted]w[redacted]d.  Placental abruption.  Progressive labor.  The patient elects to proceed with a cesarean delivery  -->Urgent C/D

## 2012-05-24 NOTE — Progress Notes (Signed)
05/24/12 04:50 Informed Dr Gaynell Face that pt's pain has increased and contractions are about 6 minutes apart last 50-60 seconds even though they are difficult to trace with toco. Given order to increase Mag to 3g/hr for 2 hrs then decrease back to 2.5 g/hr. Also informed Dr Gaynell Face that bleeding has improved some as of time of call.  Also given order for Dilaudid for pain. Pt refused IV pain med opting to try Tylenol (she did want any increased drowsiness at this time)

## 2012-05-24 NOTE — Progress Notes (Signed)
This was a follow-up visit with [name] [name].  She had just been down to see her baby for the first time since delivery.  It was very emotional for her to see how little her child is.  She remains hopeful, but is finding it so hard to feel so powerless.  Please page a chaplain over the weekend if anything should change.   [phone].  Agnes Lawrence Dariane Natzke 4:15 PM   05/24/12 1600  Clinical Encounter Type  Visited With Patient  Visit Type Follow-up;Spiritual support  Spiritual Encounters  Spiritual Needs Emotional

## 2012-05-24 NOTE — Transfer of Care (Signed)
Immediate Anesthesia Transfer of Care Note  Patient: [name] [name]  Procedure(s) Performed: Procedure(s) (LRB): CESAREAN SECTION (N/A)  Patient Location: PACU  Anesthesia Type: Spinal  Level of Consciousness: awake, alert  and oriented  Airway & Oxygen Therapy: Patient Spontanous Breathing  Post-op Assessment: Report given to PACU RN and Post -op Vital signs reviewed and stable  Post vital signs: stable  Complications: No apparent anesthesia complications

## 2012-05-24 NOTE — Anesthesia Preprocedure Evaluation (Signed)
Anesthesia Evaluation  Patient identified by MRN, date of birth, ID band Patient awake    Reviewed: Allergy & Precautions, H&P , Patient's Chart, lab work & pertinent test results  Airway Mallampati: III TM Distance: >3 FB Neck ROM: Full    Dental No notable dental hx. (+) Teeth Intact   Pulmonary neg pulmonary ROS,  breath sounds clear to auscultation  Pulmonary exam normal       Cardiovascular negative cardio ROS  Rhythm:Regular Rate:Normal     Neuro/Psych negative neurological ROS  negative psych ROS   GI/Hepatic negative GI ROS, Neg liver ROS,   Endo/Other  negative endocrine ROS  Renal/GU negative Renal ROS  negative genitourinary   Musculoskeletal   Abdominal Normal abdominal exam  (+)   Peds  Hematology negative hematology ROS (+)   Anesthesia Other Findings   Reproductive/Obstetrics (+) Pregnancy 23 weeks [place] Breech Bleeding, Suspected Placental abruption.                           Anesthesia Physical Anesthesia Plan  ASA: II and Emergent  Anesthesia Plan: General ETT   Post-op Pain Management:    Induction:   Airway Management Planned:   Additional Equipment:   Intra-op Plan:   Post-operative Plan:   Informed Consent: I have reviewed the patients History and Physical, chart, labs and discussed the procedure including the risks, benefits and alternatives for the proposed anesthesia with the patient or authorized representative who has indicated his/her understanding and acceptance.     Plan Discussed with: Anesthesiologist, CRNA and Surgeon  Anesthesia Plan Comments:         Anesthesia Quick Evaluation

## 2012-05-24 NOTE — Op Note (Signed)
Cesarean Section Procedure Note   [name] [name]   05/23/2012 - 05/24/2012  Indications: Breech Presentation and progressive PTL, abruption   Pre-operative Diagnosis: IUP @ [redacted]w[redacted]d, breech presentation, progressive PTL, abruption  Post-operative Diagnosis: Same   Surgeon: Roseanna Rainbow  Assistants: Coral Ceo A  Anesthesia: spinal  Procedure Details:  The patient was seen in the Holding Room. The risks, benefits, complications, treatment options, and expected outcomes were discussed with the patient. The patient concurred with the proposed plan, giving informed consent. The patient was identified as [name] [name] and the procedure verified as C-Section Delivery. A Time Out was held and the above information confirmed.  After induction of anesthesia, the patient was draped and prepped in the usual sterile manner. A transverse incision was made and carried down through the subcutaneous tissue to the fascia. The fascial incision was made and extended transversely. The rectus muscles were separated in the midline, bluntly. The peritoneum was identified and entered. The peritoneal incision was extended bluntly. The utero-vesical peritoneal reflection was incised transversely and the bladder flap was bluntly freed from the lower uterine segment. A midline, vertical uterine incision was made in the lower uterine segment. The incision was extended superiorly with bandage scissors.  Delivered from breech presentation was a living newborn infant.  A cord ph was sent. The umbilical cord was clamped and cut cord. A sample was obtained for evaluation. The placenta was removed Intact.  The uterine incision was closed with running locked sutures of 1-0 Monocryl. A second imbricating layer of the same suture was placed.  Hemostasis was observed. Seprafilm was applied to uterine incision and under the peritoneal closure. The paracolic gutters were irrigated. The parieto peritoneum was closed with a  running suture of 2-0 Vicryl.  The fascia was then reapproximated with running sutures of 1-0Vicryl. The skin was closed with staples. Instrument, sponge, and needle counts were correct prior the abdominal closure and were correct at the conclusion of the case.    Findings: See above.  There were retroplacental blood clots.  The membranes were stained with hemosiderin.   Estimated Blood Loss: 400 ml  Total IV Fluids: per Anesthesiology  Urine Output: per Anesthesiology  Specimens: Placenta  Complications: no complications  Disposition: PACU - hemodynamically stable.  Maternal Condition: stable   Baby condition / location:  NICU    Signed: Surgeon(s): Antionette Char, MD Brock Bad, MD

## 2012-05-25 DIAGNOSIS — O9081 Anemia of the puerperium: Secondary | ICD-10-CM | POA: Diagnosis not present

## 2012-05-25 LAB — CBC
HCT: 23.9 % — ABNORMAL LOW (ref 36.0–46.0)
Hemoglobin: 7.8 g/dL — ABNORMAL LOW (ref 12.0–15.0)
MCV: 86.6 fL (ref 78.0–100.0)
RBC: 2.76 MIL/uL — ABNORMAL LOW (ref 3.87–5.11)
RDW: 14 % (ref 11.5–15.5)
WBC: 13.6 10*3/uL — ABNORMAL HIGH (ref 4.0–10.5)

## 2012-05-25 NOTE — Clinical Social Work Note (Signed)
Clinical Social Work Department PSYCHOSOCIAL ASSESSMENT - MATERNAL/CHILD Name:  [name] [name]  Age: 29 day Referral Date:  05/24/12 Referral source:  NICU Referral reason:  NICU  I. FAMILY/HOME ENVIRONMENT  Child's legal guardian: Parent(s)  X  Grandparent     Foster parent    DSS  Name:  [name] [name] Age:  30 y/o Address: [address], [place], [place]  [address] Name:  [name] [name] Address:  Same as above  Other Household Members/Support Persons Name:  Pt also lives with FOB's sister and her children. C.   Other Support   II. PSYCHOSOCIAL DATA A. Information Source  Patient Interview X  Family Interview           Other B. Financial and Community Resources Employment   Medicaid  Yes   County  [place] Private Insurance                                  Self Pay  Food Stamps      WIC Work First       Public Housing      Section 8    Maternity Care Coordination/Child Service Coordination/Early Intervention   School Grade      Other Cultural and Environment Information Cultural Issues Impacting Care  III. STRENGTHS  Supportive family/friends Yes  Adequate Resources   Yes Compliance with medical plan  Home prepared for Child (including basic supplies)  Yes Understanding of illness          Yes Other  IV. RISK FACTORS AND CURRENT PROBLEMS V. No Problems Noted VI. Substance Abuse                                           Pt Family VII. Mental Illness     Pt Family               Family/Relationship Issues   Pt Family      Abuse/Neglect/Domestic Violence   Pt Family   Financial Resources     Pt Family  Transportation     Pt Family  DSS Involvement    Pt Family  Adjustment to Illness    Pt Family   Knowledge/Cognitive Deficit   Pt Family   Compliance with Treatment   Pt Family   Basic Needs (food, housing, etc)  Pt Family  Housing Concerns    Pt Family  Other             VIII. SOCIAL WORK ASSESSMENT  SW met with pt due to NICU admission.  SW provided  education regarding SW role.  She said she understood.  Pt displayed a sad affect and was able to identify as being sad.  She said she was glad her baby was still alive.  SW offered supportive counseling.  She stated she had no other needs at this time.  Her husband was present and appeared attentive to pt.  They were speaking Spanish to each other and it was unclear as to how much English the FOB understood.  He did say a few words in English.  She was minimally engaged.  SW reviewed chart and noted pt has received continued spiritual care from hospital chaplain.  IX. SOCIAL WORK PLAN (In Bold) No Further Intervention Required/No Barriers to Discharge Psychosocial Support and Ongoing Assessment of Needs   Patient/Family  Education Child Protective Services Report  County        Date Information/Referral to Community Resources   Other 

## 2012-05-25 NOTE — Progress Notes (Signed)
Patient ID: [name] [name], female   DOB: Aug 08, 1981, 31 y.o.   MRN: [id number] Subjective: POD# 1 s/p Cesarean Delivery.  Indications: breech  RH status/Rubella reviewed. Feeding: unknown Patient reports tolerating PO.  Denies HA/SOB/C/P/N/V/dizziness.  Breast symptoms: no.  She reports vaginal bleeding as normal, without clots.  She is ambulating, urinating without difficulty.     Objective: Vital signs in last 24 hours: BP 95/56  Pulse 60  Temp 98.3 F (36.8 C) (Oral)  Resp 18  Ht 5\' 7"  (1.702 m)  Wt 103.08 kg (227 lb 4 oz)  BMI 35.59 kg/m2  SpO2 97%  LMP 12/12/2011  Breastfeeding? Unknown       Physical Exam:  General: alert CV: Regular rate and rhythm Resp: clear Abdomen: soft, nontender, normal bowel sounds Lochia: minimal Uterine Fundus: firm, below umbilicus, nontender  Ext: extremities normal, atraumatic, no cyanosis or edema    Basename 05/25/12 0505 05/24/12 0721  HGB 7.8* 11.1*  HCT 23.9* 33.3*      Assessment/Plan: 31 y.o.  status post Cesarean section. POD# 1.   Anemia secondary to blood loss.  Hemodynamically stable. Repeat H/H tomorrow            Advance diet as tolerated Start po pain meds D/C foley  HLIV  Ambulate IS Routine post-op care  [name],[name] Skalicky A 05/25/2012, 10:48 AM

## 2012-05-26 DIAGNOSIS — O902 Hematoma of obstetric wound: Secondary | ICD-10-CM | POA: Diagnosis not present

## 2012-05-26 LAB — HEMOGLOBIN AND HEMATOCRIT, BLOOD: HCT: 24 % — ABNORMAL LOW (ref 36.0–46.0)

## 2012-05-26 MED ORDER — SILVER NITRATE-POT NITRATE 75-25 % EX MISC
2.0000 | Freq: Once | CUTANEOUS | Status: AC
  Administered 2012-05-26: 2 via TOPICAL
  Filled 2012-05-26: qty 2

## 2012-05-26 NOTE — Progress Notes (Signed)
Patient ID: [name] [name], female   DOB: Nov 04, 1980, 31 y.o.   MRN: [id number] Subjective: POD# 2 s/p Cesarean Delivery.  Indications: breech  RH status/Rubella reviewed. Feeding: unknown Patient reports tolerating PO.  Denies HA/SOB/C/P/N/V/dizziness.  Breast symptoms: no.  She reports vaginal bleeding as normal, without clots.  She is ambulating, urinating without difficulty.     Objective: Vital signs in last 24 hours: BP 93/52  Pulse 71  Temp 97.6 F (36.4 C) (Oral)  Resp 16  Ht 5\' 7"  (1.702 m)  Wt 103.08 kg (227 lb 4 oz)  BMI 35.59 kg/m2  SpO2 98%  LMP 12/12/2011  Breastfeeding? Unknown       Physical Exam:  General: alert CV: Regular rate and rhythm Resp: clear Abdomen: soft, nontender, normal bowel sounds Lochia: minimal Uterine Fundus: firm, below umbilicus, nontender Ext: extremities normal, atraumatic, no cyanosis or edema Incision: bloody drainage--right margin  Procedure note: The wound was prepped with betadine/draped.  The staples from the right margin to the midline were removed.  The wound was explored.  Blood clot was evacuated.  The fascia was intact.  No discrete bleeding points were noted.  The incision was infiltrated with 1% lidocaine.  Interrupted figure of eight sutures of 2-0 Vicryl were placed in the subcutaneous layer.  Silver nitrate was applied. The wound was irrigated.  Staples were reapplied.   Basename 05/26/12 0514 05/25/12 0505  HGB 7.7* 7.8*  HCT 24.0* 23.9*      Assessment/Plan: 30 y.o.  status post Cesarean section. POD# 2.   Wound hematoma now s/p evacuation.  Ambulate IS Routine post-op care  JACKSON-MOORE,Debe Anfinson A 05/26/2012, 12:29 PM

## 2012-05-27 ENCOUNTER — Inpatient Hospital Stay (HOSPITAL_COMMUNITY)
Admission: AD | Admit: 2012-05-27 | Discharge: 2012-05-28 | Disposition: A | Discharge status: Home / Self Care | Payer: Medicaid Other | Source: Ambulatory Visit | Attending: Obstetrics | Admitting: Obstetrics

## 2012-05-27 ENCOUNTER — Encounter (HOSPITAL_COMMUNITY): Payer: Self-pay | Admitting: Obstetrics & Gynecology

## 2012-05-27 DIAGNOSIS — O902 Hematoma of obstetric wound: Secondary | ICD-10-CM

## 2012-05-27 DIAGNOSIS — O909 Complication of the puerperium, unspecified: Secondary | ICD-10-CM | POA: Insufficient documentation

## 2012-05-27 DIAGNOSIS — O9081 Anemia of the puerperium: Secondary | ICD-10-CM

## 2012-05-27 DIAGNOSIS — O4590 Premature separation of placenta, unspecified, unspecified trimester: Secondary | ICD-10-CM

## 2012-05-27 MED ORDER — OXYCODONE-ACETAMINOPHEN 5-325 MG PO TABS
2.0000 | ORAL_TABLET | ORAL | Status: AC
  Administered 2012-05-27: 2 via ORAL
  Filled 2012-05-27: qty 2

## 2012-05-27 MED ORDER — FERROUS SULFATE 325 (65 FE) MG PO TABS: 325.0000 mg | ORAL_TABLET | Freq: Two times a day (BID) | ORAL | Status: DC

## 2012-05-27 MED ORDER — OXYCODONE-ACETAMINOPHEN 5-325 MG PO TABS: 1.0000 | ORAL_TABLET | Freq: Four times a day (QID) | ORAL | Status: AC | PRN

## 2012-05-27 NOTE — MAU Note (Signed)
C/o foul odor and pain at incision; observed brown drainage on abdominal pad;

## 2012-05-27 NOTE — MAU Note (Signed)
Pt discharged home today, s/p C/S on 08/16. States her incision had to reopened yesterday because of "blood clots. Pt states incision has a bad odor. Pt reports this pm she has had chills and ?fever.

## 2012-05-27 NOTE — Discharge Summary (Signed)
  Obstetric Discharge Summary Reason for Admission: observation/evaluation and abruptio placentae, PTL Prenatal Procedures: none Intrapartum Procedures: cesarean: classical Postpartum Procedures: evacuation of a wound hematoma Complications-Operative and Postpartum: wound hematoma  Hemoglobin  Date Value Range Status  05/26/2012 7.7* 12.0 - 15.0 g/dL Final     REPEATED TO VERIFY     HCT  Date Value Range Status  05/26/2012 24.0* 36.0 - 46.0 % Final     REPEATED TO VERIFY    Physical Exam: Deferred.  Pt visiting newborn in NICU.   Discharge Diagnoses: Active Problems:  Antepartum placental abruption  Cesarean delivery delivered  Anemia of mother in pregnancy, postpartum condition  Wound hematoma following cesarean section, postpartum   Discharge Information: Date: 05/27/2012 Activity: pelvic rest Diet: routine Medications:  Prior to Admission medications   Medication Sig Start Date End Date Taking? Authorizing Provider  Prenatal Vit-Fe Fumarate-FA (PRENATAL MULTIVITAMIN) TABS Take 1 tablet by mouth daily.   Yes Historical Provider, MD  ferrous sulfate 325 (65 FE) MG tablet Take 1 tablet (325 mg total) by mouth 2 (two) times daily before a meal. 05/27/12 05/27/13  Antionette Char, MD  oxyCODONE-acetaminophen (PERCOCET/ROXICET) 5-325 MG per tablet Take 1-2 tablets by mouth every 6 (six) hours as needed (moderate - severe pain). 05/27/12 06/06/12  Antionette Char, MD    Condition: stable Instructions: refer to routine discharge instructions Discharge to: home Follow-up Information    Follow up with Antionette Char A, MD. Schedule an appointment as soon as possible for a visit in 3 days.   Contact information:   [address] [place] [address] [phone]          Newborn Data: Live born  Information for the patient's newborn:  Vivien, Barretto Girl Jeny [[id number]]  female  NICU.  JACKSON-MOORE,Davi Kroon A 05/27/2012, 9:03 AM

## 2012-05-27 NOTE — MAU Provider Note (Signed)
History     CSN: [id number]  Arrival date and time: 05/27/12 2119   First Provider Initiated Contact with Patient 05/27/12 2334      Chief Complaint  Patient presents with  . Drainage from Incision   HPI  31 y.o. [id number] presents to MAU 3 days PP from C/S with incisional pain, leakage from site, and foul odor from incision site.  She has normal lochia with no change today,  Her infant is in the NICU and she went home from the hospital this morning when she was discharged but returned to visit the baby in the afternoon.  She denies vaginal itching/burning, urinary symptoms, h/a, dizziness, n/v, or fever/chills.    Past Medical History  Diagnosis Date  . HPV (human papilloma virus) infection     Past Surgical History  Procedure Date  . Induced abortion   . Cesarean section 05/24/2012    Procedure: CESAREAN SECTION;  Surgeon: Antionette Char, MD;  Location: [facility];  Service: Gynecology;  Laterality: N/A;    Family History  Problem Relation Age of Onset  . Anesthesia problems Neg Hx   . Other Neg Hx     History  Substance Use Topics  . Smoking status: Former Smoker    Types: Cigarettes  . Smokeless tobacco: Not on file   Comment: with preg  . Alcohol Use: Yes     none with preg    Allergies: No Known Allergies  Prescriptions prior to admission  Medication Sig Dispense Refill  . ferrous sulfate 325 (65 FE) MG tablet Take 1 tablet (325 mg total) by mouth 2 (two) times daily before a meal.  60 tablet  11  . ibuprofen (ADVIL,MOTRIN) 200 MG tablet Take 400 mg by mouth once. For pain      . oxyCODONE-acetaminophen (PERCOCET/ROXICET) 5-325 MG per tablet Take 1-2 tablets by mouth every 6 (six) hours as needed (moderate - severe pain).  30 tablet  0  . Prenatal Vit-Fe Fumarate-FA (PRENATAL MULTIVITAMIN) TABS Take 1 tablet by mouth daily.        Review of Systems  Constitutional: Negative for fever, chills and malaise/fatigue.  Eyes: Negative for blurred vision.    Respiratory: Negative for cough and shortness of breath.   Cardiovascular: Negative for chest pain.  Gastrointestinal: Positive for abdominal pain. Negative for heartburn, nausea and vomiting.  Genitourinary: Negative for dysuria, urgency and frequency.  Musculoskeletal: Negative.   Neurological: Negative for dizziness and headaches.  Psychiatric/Behavioral: Negative for depression.   Physical Exam   Blood pressure 117/70, pulse 94, temperature 98.4 F (36.9 C), temperature source Oral, resp. rate 18, height 5\' 7"  (1.702 m), last menstrual period 12/12/2011, SpO2 100.00%, unknown if currently breastfeeding.  Physical Exam  Nursing note and vitals reviewed. Constitutional: She is oriented to person, place, and time. She appears well-developed and well-nourished.  Neck: Normal range of motion.  Cardiovascular: Normal rate, regular rhythm and normal heart sounds.   Respiratory: Effort normal.  GI: Soft. Bowel sounds are normal.    Musculoskeletal: Normal range of motion.  Neurological: She is alert and oriented to person, place, and time.  Skin: Skin is warm and dry.  Psychiatric: She has a normal mood and affect. Her behavior is normal. Judgment and thought content normal.    MAU Course  Procedures Percocet x2 tabs in MAU  Assessment and Plan  Incisional pain/drainage   Called Dr Gaynell Face to discuss assessment and findings D/C home F/U with Dr Tamela Oddi in office tomorrow Encouraged pt to  leave dressing off, wash incision with soap and water daily Rest, increase fluids Return to MAU as needed  LEFTWICH-KIRBY, Azhia Siefken 05/27/2012, 11:50 PM

## 2012-05-27 NOTE — Progress Notes (Signed)
Post discharge ur review completed. 

## 2012-05-27 NOTE — Progress Notes (Signed)
Pt discharged home... Plans to pump and visit baby in NICU, and will leave from the NICU... Condition stable... No equipment.

## 2012-05-27 NOTE — Progress Notes (Signed)
This was a follow-up visit with [name] [name].  She seemed to be less anxious today--especially since her daughter is stable.  She had family and friends in to visit with her today and it seems that her family is a very strong support for her.    She is aware of on-going availability of chaplain support in the NICU and we will continue to check in with her when we see her.  Please page as needs arise or as pt or family request, [phone].  Chaplain Katy Anntionette Madkins 10:07 AM

## 2012-05-27 NOTE — MAU Note (Signed)
PT SAYS INCISION HURTS.  INTACT WITH STAPLES, NO DRAINAGE,  AREA RED,   WITH ODOR

## 2012-05-27 NOTE — MAU Note (Signed)
Delivered via c-section, this past Friday;MD open incision and removed hematoma yesterday; pt is back today with worsening pain and odor from incision;

## 2012-05-28 DIAGNOSIS — O909 Complication of the puerperium, unspecified: Secondary | ICD-10-CM

## 2012-05-28 LAB — CBC
HCT: 28.7 % — ABNORMAL LOW (ref 36.0–46.0)
Hemoglobin: 9.4 g/dL — ABNORMAL LOW (ref 12.0–15.0)
MCV: 84.9 fL (ref 78.0–100.0)
Platelets: 265 10*3/uL (ref 150–400)
RBC: 3.38 MIL/uL — ABNORMAL LOW (ref 3.87–5.11)
WBC: 11.8 10*3/uL — ABNORMAL HIGH (ref 4.0–10.5)

## 2012-09-06 ENCOUNTER — Encounter (HOSPITAL_COMMUNITY): Payer: Self-pay | Admitting: *Deleted

## 2012-09-06 ENCOUNTER — Emergency Department (HOSPITAL_COMMUNITY)
Admission: EM | Admit: 2012-09-06 | Discharge: 2012-09-06 | Discharge status: Against Medical Advice | Payer: Medicaid Other | Attending: Emergency Medicine | Admitting: Emergency Medicine

## 2012-09-06 DIAGNOSIS — Z206 Contact with and (suspected) exposure to human immunodeficiency virus [HIV]: Secondary | ICD-10-CM

## 2012-09-06 DIAGNOSIS — Z8619 Personal history of other infectious and parasitic diseases: Secondary | ICD-10-CM | POA: Insufficient documentation

## 2012-09-06 DIAGNOSIS — Z20828 Contact with and (suspected) exposure to other viral communicable diseases: Secondary | ICD-10-CM | POA: Insufficient documentation

## 2012-09-06 DIAGNOSIS — Z87891 Personal history of nicotine dependence: Secondary | ICD-10-CM | POA: Insufficient documentation

## 2012-09-06 NOTE — ED Notes (Signed)
Pt were informed by Dr. Estell Harpin to follow up with health department or PCP

## 2012-09-06 NOTE — ED Provider Notes (Signed)
Pt wanted hiv test. Pt referred to health department  Benny Lennert, MD 09/06/12 [phone]

## 2012-09-06 NOTE — ED Notes (Signed)
Pt reports that she is here for HIV testing after her infant daughter had positive HIV test. Pt reports negative HIV test back in July.

## 2012-09-06 NOTE — ED Notes (Signed)
The pt is here for an aids test.  Her daughter is in [facility].  Unclear why she needs one

## 2013-01-07 ENCOUNTER — Telehealth: Payer: Self-pay | Admitting: *Deleted

## 2013-01-07 NOTE — Telephone Encounter (Signed)
Patient called requesting an appointment for her IUD insertion. Pt just finished her cycle. Per Dr Clearance Coots- pt has to be on her cycle. Explained to patient and patient to call back on day 1- OCP refilled at patient pharmacy.

## 2013-01-31 ENCOUNTER — Encounter: Payer: Self-pay | Admitting: Obstetrics

## 2013-02-03 ENCOUNTER — Encounter: Payer: Self-pay | Admitting: Obstetrics & Gynecology

## 2013-02-03 ENCOUNTER — Ambulatory Visit (INDEPENDENT_AMBULATORY_CARE_PROVIDER_SITE_OTHER): Payer: Medicaid Other | Admitting: Obstetrics & Gynecology

## 2013-02-03 VITALS — BP 116/80 | HR 55 | Temp 98.7°F | Ht 67.0 in | Wt 236.6 lb

## 2013-02-03 DIAGNOSIS — IMO0001 Reserved for inherently not codable concepts without codable children: Secondary | ICD-10-CM

## 2013-02-03 DIAGNOSIS — Z30430 Encounter for insertion of intrauterine contraceptive device: Secondary | ICD-10-CM

## 2013-02-03 DIAGNOSIS — Z3202 Encounter for pregnancy test, result negative: Secondary | ICD-10-CM

## 2013-02-03 NOTE — Patient Instructions (Addendum)
Levonorgestrel intrauterine device (IUD) What is this medicine? LEVONORGESTREL IUD (LEE voe nor jes trel) is a contraceptive (birth control) device. The device is placed inside the uterus by a healthcare professional. It is used to prevent pregnancy and can also be used to treat heavy bleeding that occurs during your period. Depending on the device, it can be used for 3 to 5 years. This medicine may be used for other purposes; ask your health care provider or pharmacist if you have questions. What should I tell my health care provider before I take this medicine? They need to know if you have any of these conditions: -abnormal Pap smear -cancer of the breast, uterus, or cervix -diabetes -endometritis -genital or pelvic infection now or in the past -have more than one sexual partner or your partner has more than one partner -heart disease -history of an ectopic or tubal pregnancy -immune system problems -IUD in place -liver disease or tumor -problems with blood clots or take blood-thinners -use intravenous drugs -uterus of unusual shape -vaginal bleeding that has not been explained -an unusual or allergic reaction to levonorgestrel, other hormones, silicone, or polyethylene, medicines, foods, dyes, or preservatives -pregnant or trying to get pregnant -breast-feeding How should I use this medicine? This device is placed inside the uterus by a health care professional. Talk to your pediatrician regarding the use of this medicine in children. Special care may be needed. Overdosage: If you think you have taken too much of this medicine contact a poison control center or emergency room at once. NOTE: This medicine is only for you. Do not share this medicine with others. What if I miss a dose? This does not apply. What may interact with this medicine? Do not take this medicine with any of the following medications: -amprenavir -bosentan -fosamprenavir This medicine may also interact with  the following medications: -aprepitant -barbiturate medicines for inducing sleep or treating seizures -bexarotene -griseofulvin -medicines to treat seizures like carbamazepine, ethotoin, felbamate, oxcarbazepine, phenytoin, topiramate -modafinil -pioglitazone -rifabutin -rifampin -rifapentine -some medicines to treat HIV infection like atazanavir, indinavir, lopinavir, nelfinavir, tipranavir, ritonavir -St. John's wort -warfarin This list may not describe all possible interactions. Give your health care provider a list of all the medicines, herbs, non-prescription drugs, or dietary supplements you use. Also tell them if you smoke, drink alcohol, or use illegal drugs. Some items may interact with your medicine. What should I watch for while using this medicine? Visit your doctor or health care professional for regular check ups. See your doctor if you or your partner has sexual contact with others, becomes HIV positive, or gets a sexual transmitted disease. This product does not protect you against HIV infection (AIDS) or other sexually transmitted diseases. You can check the placement of the IUD yourself by reaching up to the top of your vagina with clean fingers to feel the threads. Do not pull on the threads. It is a good habit to check placement after each menstrual period. Call your doctor right away if you feel more of the IUD than just the threads or if you cannot feel the threads at all. The IUD may come out by itself. You may become pregnant if the device comes out. If you notice that the IUD has come out use a backup birth control method like condoms and call your health care provider. Using tampons will not change the position of the IUD and are okay to use during your period. What side effects may I notice from receiving this medicine?   Side effects that you should report to your doctor or health care professional as soon as possible: -allergic reactions like skin rash, itching or  hives, swelling of the face, lips, or tongue -fever, flu-like symptoms -genital sores -high blood pressure -no menstrual period for 6 weeks during use -pain, swelling, warmth in the leg -pelvic pain or tenderness -severe or sudden headache -signs of pregnancy -stomach cramping -sudden shortness of breath -trouble with balance, talking, or walking -unusual vaginal bleeding, discharge -yellowing of the eyes or skin Side effects that usually do not require medical attention (report to your doctor or health care professional if they continue or are bothersome): -acne -breast pain -change in sex drive or performance -changes in weight -cramping, dizziness, or faintness while the device is being inserted -headache -irregular menstrual bleeding within first 3 to 6 months of use -nausea This list may not describe all possible side effects. Call your doctor for medical advice about side effects. You may report side effects to FDA at 1-800-FDA-1088. Where should I keep my medicine? This does not apply. NOTE: This sheet is a summary. It may not cover all possible information. If you have questions about this medicine, talk to your doctor, pharmacist, or health care provider.  2013, [employer]. (10/26/2011 1:54:04 PM)  

## 2013-02-03 NOTE — Progress Notes (Signed)
IUD Insertion Procedure Note  Pre-operative Diagnosis: Desires contraception  Post-operative Diagnosis: same  Indications: contraception  Procedure Details  Urine pregnancy test was done and result was negative.  The risks (including infection, bleeding, pain, and uterine perforation) and benefits of the procedure were explained to the patient and Written informed consent was obtained.    Cervix cleansed with Betadine. Uterus sounded to 7 cm. IUD inserted without difficulty. String visible and trimmed. Patient tolerated procedure well.  IUD Information: Mirena.  Condition: Stable  Complications: None  Plan:  The patient was advised to call for any fever or for prolonged or severe pain or bleeding. She was advised to use OTC analgesics as needed for mild to moderate pain.

## 2013-03-10 ENCOUNTER — Ambulatory Visit (INDEPENDENT_AMBULATORY_CARE_PROVIDER_SITE_OTHER): Payer: Medicaid Other | Admitting: Obstetrics & Gynecology

## 2013-03-10 ENCOUNTER — Encounter: Payer: Self-pay | Admitting: Obstetrics & Gynecology

## 2013-03-10 VITALS — BP 132/74 | HR 73 | Temp 97.8°F | Wt 234.0 lb

## 2013-03-10 DIAGNOSIS — Z30431 Encounter for routine checking of intrauterine contraceptive device: Secondary | ICD-10-CM

## 2013-03-10 NOTE — Progress Notes (Signed)
Subjective:     [name] [name] is a 32 y.o. female here for a routine exam.  Current complaints: in office today for an IUD check. Pt states her partner is feeling the IUD strings during intercourse. Personal health questionnaire reviewed: no.   Gynecologic History Patient's last menstrual period was 03/03/2013. Contraception: IUD Last Pap: 2013. Results were: normal Last mammogram: n/a. Results were: n/a  Obstetric History OB History   Grav Para Term Preterm Abortions TAB SAB Ect Mult Living   2 1  1 1 1    2      # Outc Date GA Lbr Len/2nd Wgt Sex Del Anes PTL Lv   1 PRE 8/13 [redacted]w[redacted]d 00:00 / 00:31  F LTCS Spinal  Yes   2 TAB                The following portions of the patient's history were reviewed and updated as appropriate: allergies, current medications, past family history, past medical history, past social history, past surgical history and problem list.  Review of Systems Pertinent items are noted in HPI.    Objective:    Pelvic: cervix normal in appearance and vagina normal without discharge  IUD strings trimmed. Informal U/S: IUD in good position  Assessment:    F/U of IUD   Plan:   Return prn

## 2013-03-10 NOTE — Patient Instructions (Addendum)
Intrauterine Device Information  An intrauterine device (IUD) is inserted into your uterus and prevents pregnancy. There are 2 types of IUDs available:  · Copper IUD. This type of IUD is wrapped in copper wire and is placed inside the uterus. Copper makes the uterus and fallopian tubes produce a fluid that kills sperm. The copper IUD can stay in place for 10 years.  · Hormone IUD. This type of IUD contains the hormone progestin (synthetic progesterone). The hormone thickens the cervical mucus and prevents sperm from entering the uterus, and it also thins the uterine lining to prevent implantation of a fertilized egg. The hormone can weaken or kill the sperm that get into the uterus. The hormone IUD can stay in place for 5 years.  Your caregiver will make sure you are a good candidate for a contraceptive IUD. Discuss with your caregiver the possible side effects.  ADVANTAGES  · It is highly effective, reversible, long-acting, and low maintenance.  · There are no estrogen-related side effects.  · An IUD can be used when breastfeeding.  · It is not associated with weight gain.  · It works immediately after insertion.  · The copper IUD does not interfere with your female hormones.  · The progesterone IUD can make heavy menstrual periods lighter.  · The progesterone IUD can be used for 5 years.  · The copper IUD can be used for 10 years.  DISADVANTAGES  · The progesterone IUD can be associated with irregular bleeding patterns.  · The copper IUD can make your menstrual flow heavier and more painful.  · You may experience cramping and vaginal bleeding after insertion.  Document Released: 08/29/2004 Document Revised: 12/18/2011 Document Reviewed: 01/28/2011  [employer]® Patient Information ©2014 [employer].

## 2013-07-28 IMAGING — US US OB DETAIL+14 WK
2 series · 12 of 28 positions shown · non-contrast
Comparison: none

[Series 1: us ob detail +14 wk · 69 acquisitions, 10 frames shown (1 of 2)]
[im 3/69]
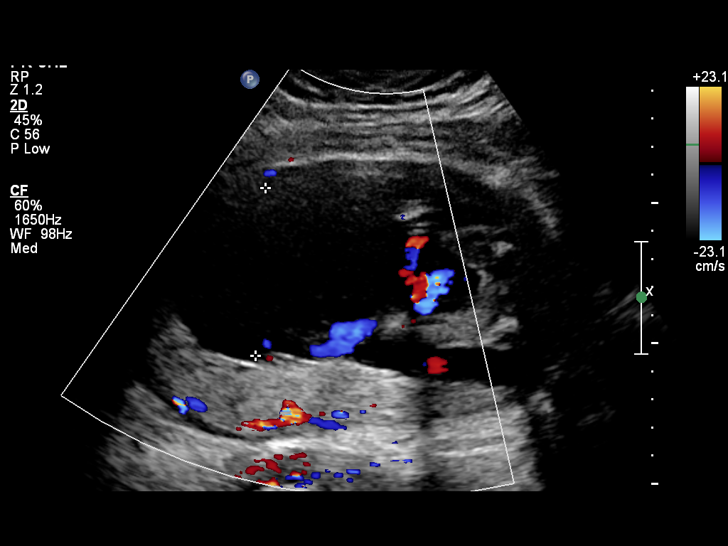
[im 9/69]
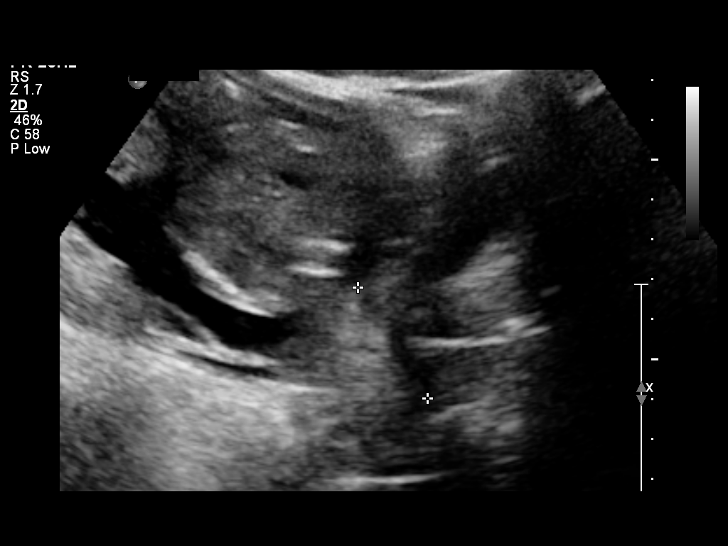
[im 15/69]
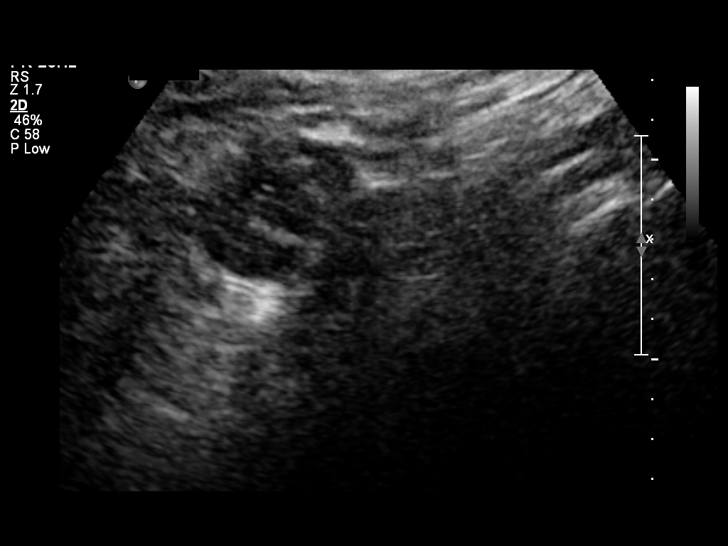
[im 24/69]
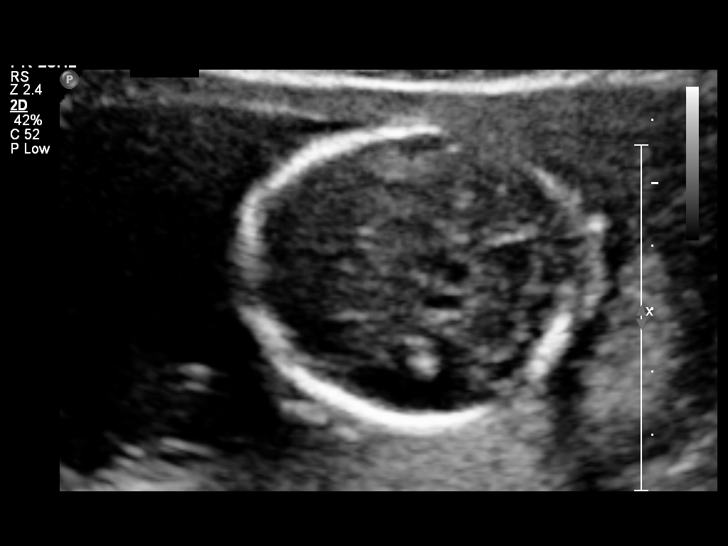
[im 30/69]
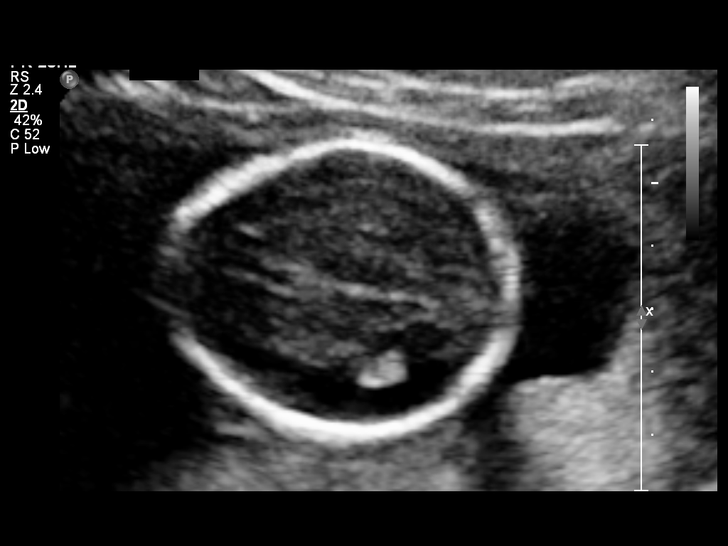
[im 36/69]
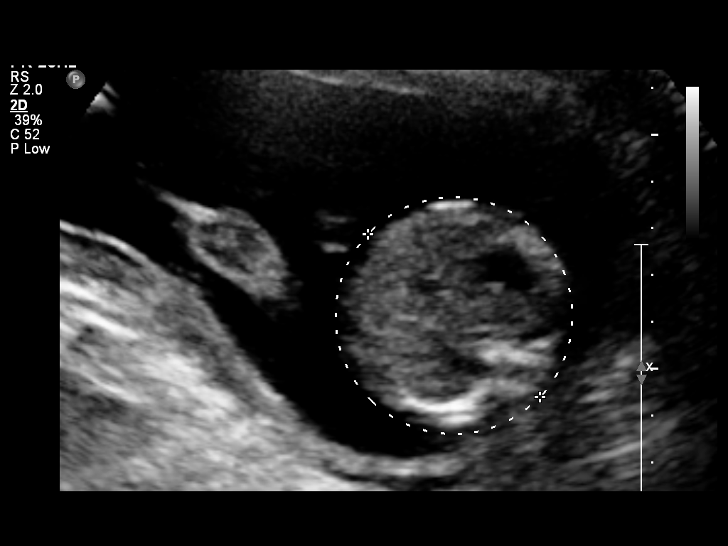
[im 45/69]
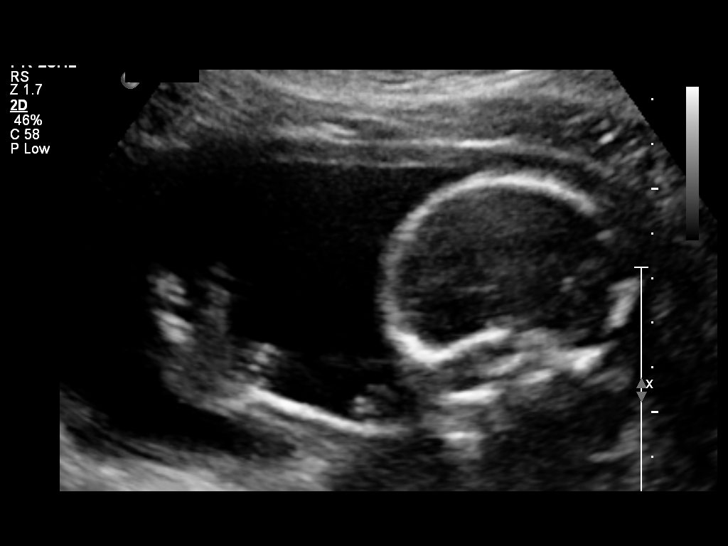
[im 51/69]
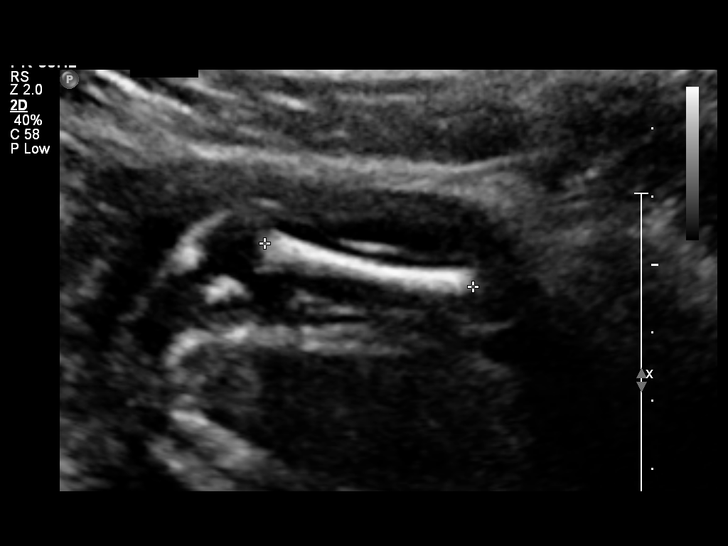
[im 57/69]
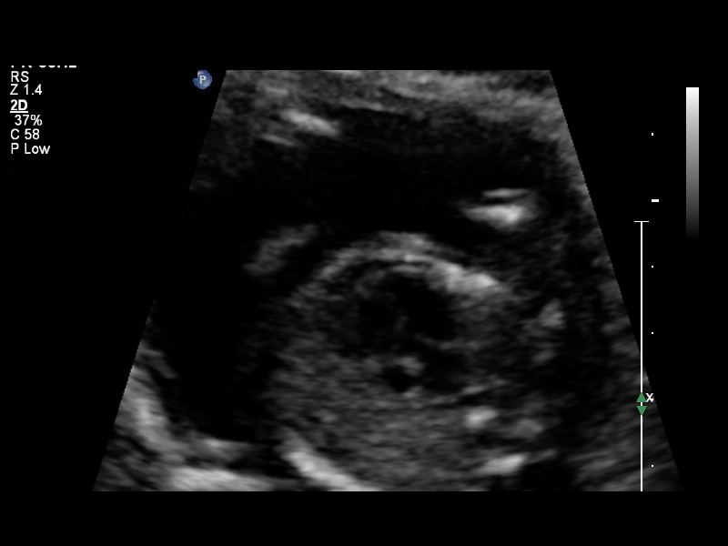
[im 66/69]
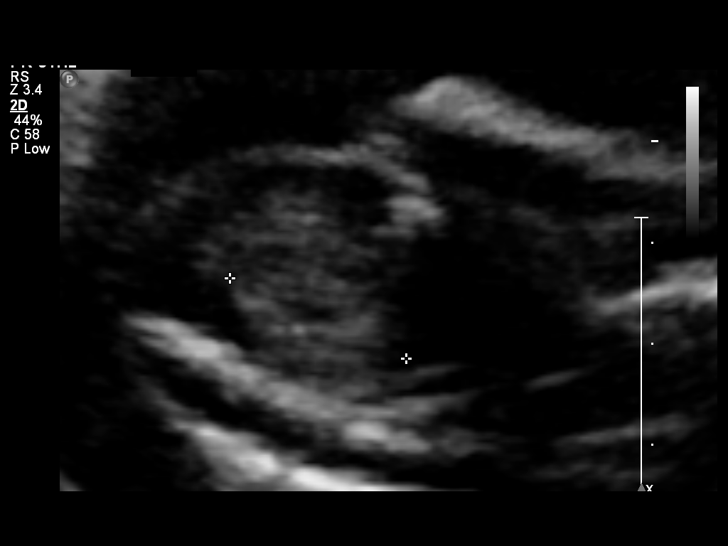

[Series 1: us ob detail +14 wk · 2 of 10 slices shown (2 of 2)]
[im 1/10]
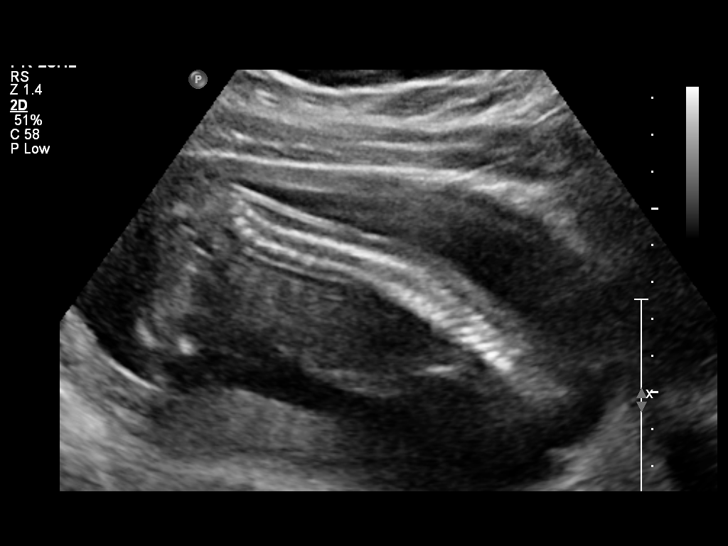
[im 7/10]
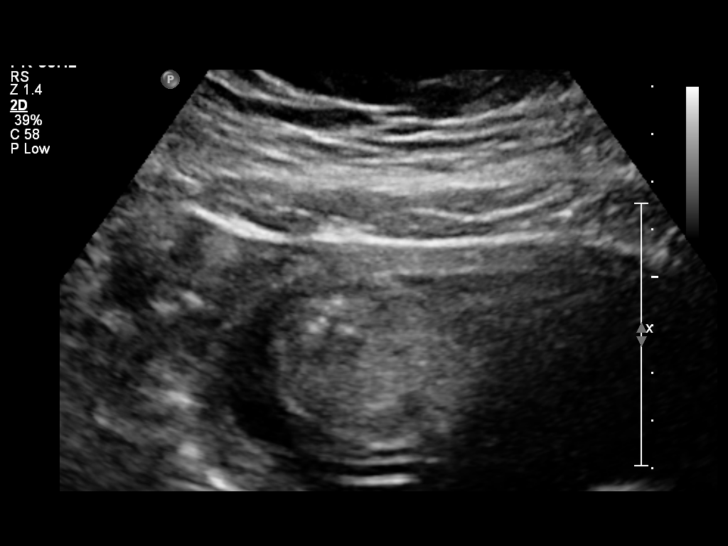

[12 of 28 positions shown; findings below may reference images not displayed]

OBSTETRICS REPORT
                      (Signed Final 05/01/2012 [DATE])

 Order#:         [id number]
Procedures

 US OB DETAIL + 14 WK                                  76811.0
Indications

 Detailed fetal anatomic survey
 [facility]
Fetal Evaluation

 Fetal Heart Rate:  147                         bpm
 Cardiac Activity:  Observed
 Presentation:      Variable
 Placenta:          Posterior, above cervical
                    os
 P. Cord            Visualized
 Insertion:

 Amniotic Fluid
 AFI FV:      Subjectively within normal limits
                                             Larg Pckt:     6.0  cm
Biometry

 BPD:     45.3  mm    G. Age:   19w 5d                CI:        74.72   70 - 86
                                                      FL/HC:      19.2   16.8 -

 HC:     166.3  mm    G. Age:   19w 2d       11  %    HC/AC:      1.07   1.09 -

 AC:     155.4  mm    G. Age:   20w 5d       63  %    FL/BPD:
 FL:      31.9  mm    G. Age:   20w 0d       35  %    FL/AC:      20.5   20 - 24
 HUM:     30.7  mm    G. Age:   20w 1d       53  %
 CER:     18.1  mm    G. Age:   18w 0d      < 5  %
 NFT:     4.37  mm

 Est. FW:     340  gm    0 lb 12 oz      51  %
Gestational Age

 LMP:           20w 1d       Date:   12/12/11                 EDD:   09/17/12
 U/S Today:     19w 6d                                        EDD:   09/19/12
 Best:          20w 1d    Det. By:   LMP  (12/12/11)          EDD:   09/17/12
Genetic Sonogram - Trisomy 21 Screening
 Age:                                             31          Risk=1:   680
 Echogenic bowel:                                 No
 Choroid plexus cysts:                            No
 Structural anomalies (inc. cardiac):             No
 2-vessel umbilical cord:                         No
 Pyelectasis:                                     No
 Echogenic cardiac foci:                          No
Anatomy

 Cranium:           Appears normal      Aortic Arch:       Not well
                                                           visualized
 Fetal Cavum:       Appears normal      Ductal Arch:       Not well
                                                           visualized
 Ventricles:        Appears normal      Diaphragm:         Appears normal
 Choroid Plexus:    Appears normal      Stomach:           Appears
                                                           normal, left
                                                           sided
 Cerebellum:        Appears normal      Abdomen:           Appears normal
 Posterior Fossa:   Appears normal      Abdominal Wall:    Appears nml
                                                           (cord insert,
                                                           abd wall)
 Nuchal Fold:       Appears normal      Cord Vessels:      Appears normal
                    (neck, nuchal                          (3 vessel cord)
                    fold)
 Face:              Lips and orbits     Kidneys:           Appear normal
                    appear normal
 Heart:             Appears normal      Bladder:           Appears normal
                    (4 chamber &
                    axis)
 RVOT:              Appears normal      Spine:             Appears normal
 LVOT:              Appears normal      Limbs:             Appears normal
                                                           (hands, ankles,
                                                           feet)

 Other:     Fetus appears to be a female. Heels visualized.
            Technically difficult due to fetal position.
Targeted Anatomy

 Fetal Central Nervous System
 Lat. Ventricles:   6.6                 Cisterna Magna:
Cervix Uterus Adnexa

 Cervical Length:   3.28      cm

 Cervix:       Normal appearance by transabdominal scan.
 Left Ovary:   Within normal limits.
 Right Ovary:  Within normal limits.
 Adnexa:     No abnormality visualized.
Impression

 Assigned GA is currently 20w 1d.   Appropriate interval fetal
 growth.
 No fetal anomalies seen involving visualized anatomy.
 No sonographic markers for aneuploidy visualized.
 questions or concerns.

## 2013-07-31 ENCOUNTER — Ambulatory Visit: Payer: Medicaid Other | Admitting: Obstetrics & Gynecology

## 2013-08-19 IMAGING — US US OB LIMITED
1 series · 14 of 22 positions shown · non-contrast
Comparison: none

CLINICAL DATA: Bleeding.

[Series 1: us ob limited · 14 of 22 slices shown]
[im 1/22]
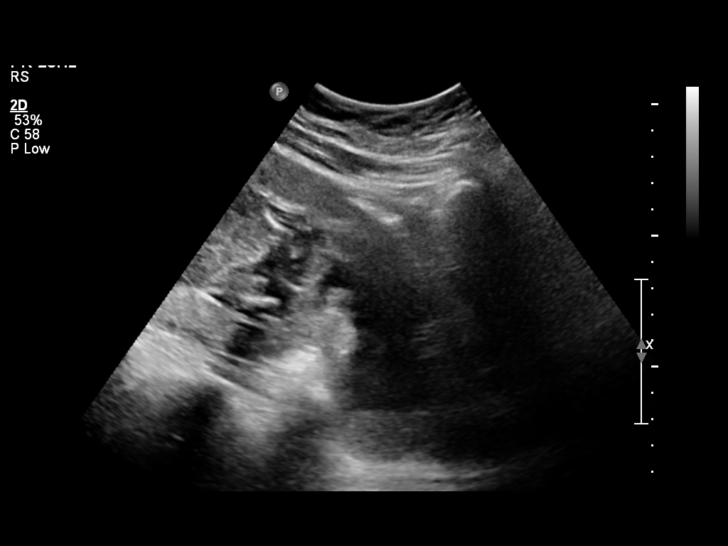
[im 3/22]
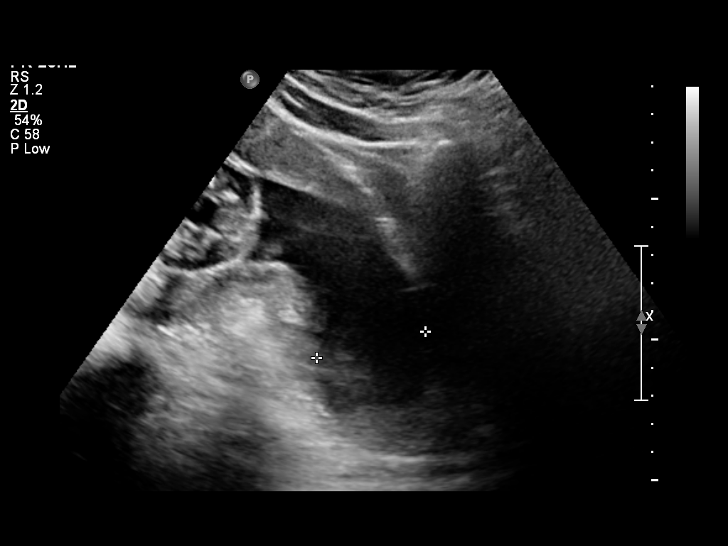
[im 4/22]
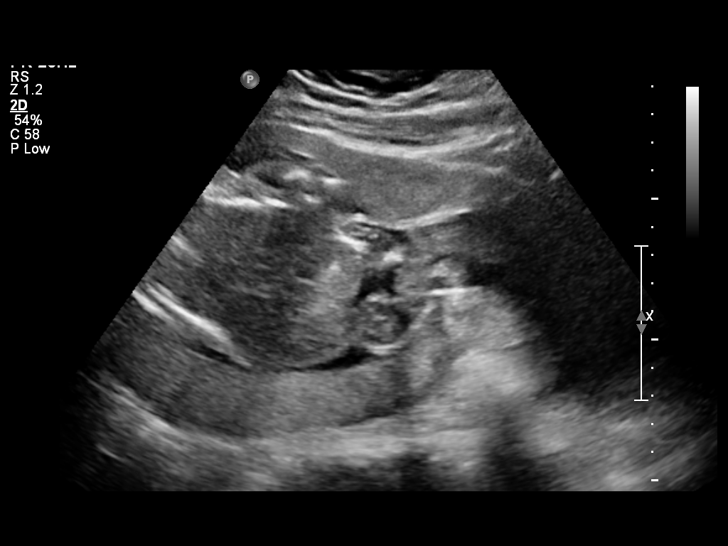
[im 6/22]
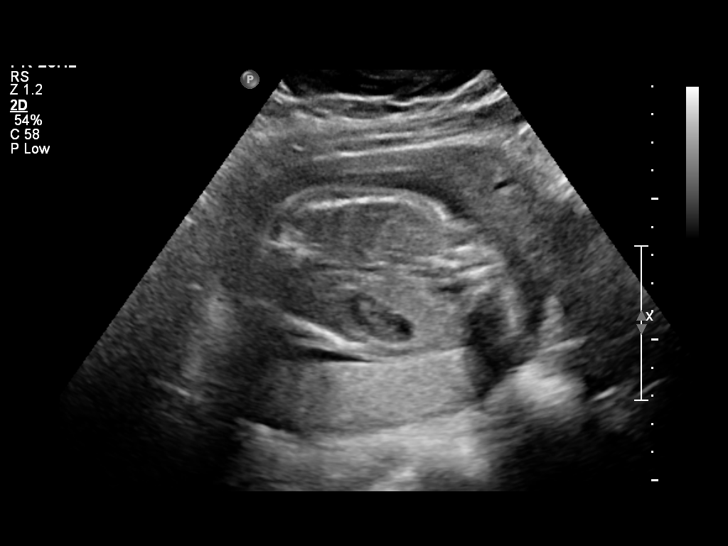
[im 8/22]
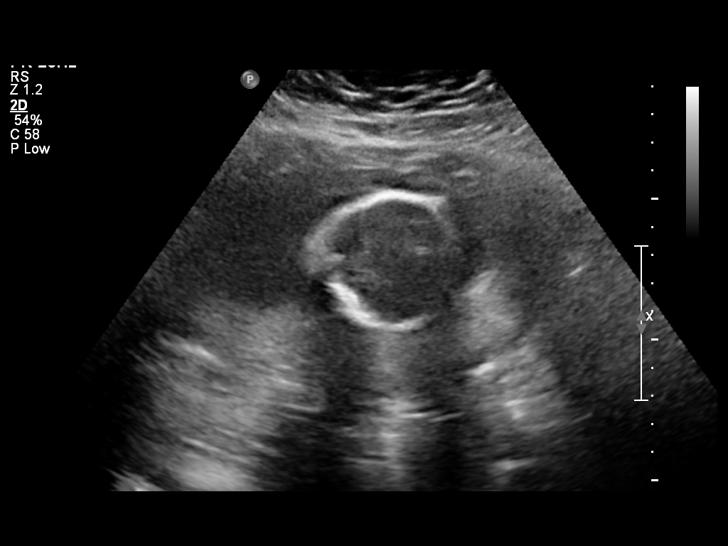
[im 9/22]
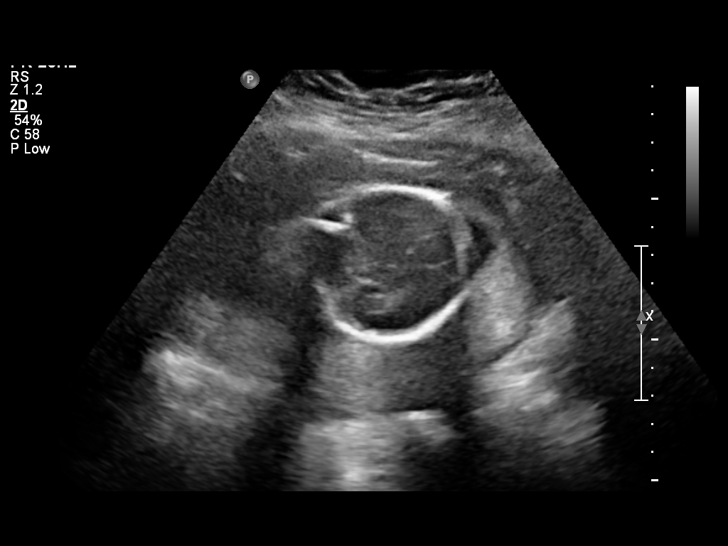
[im 11/22]
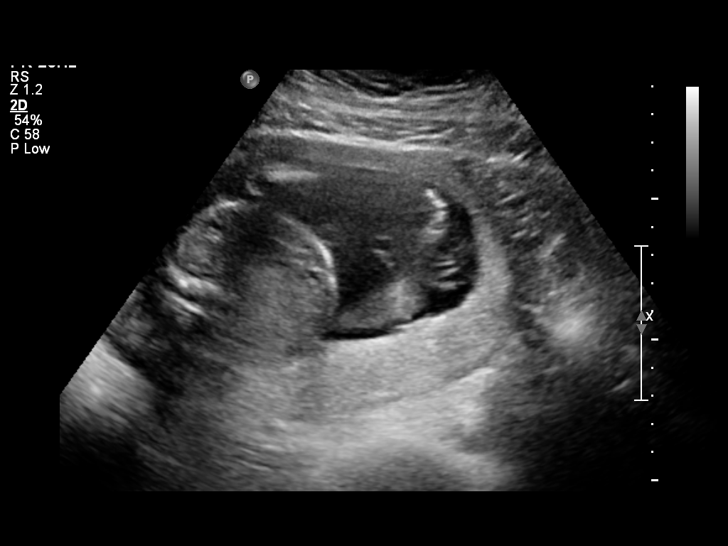
[im 12/22]
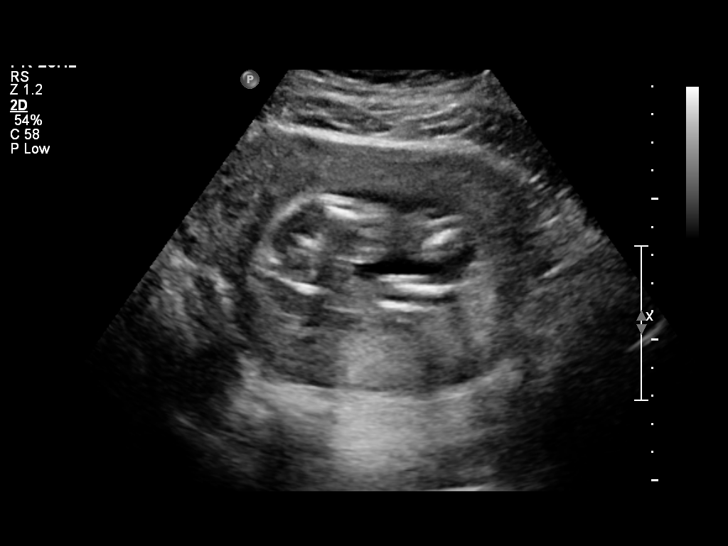
[im 14/22]
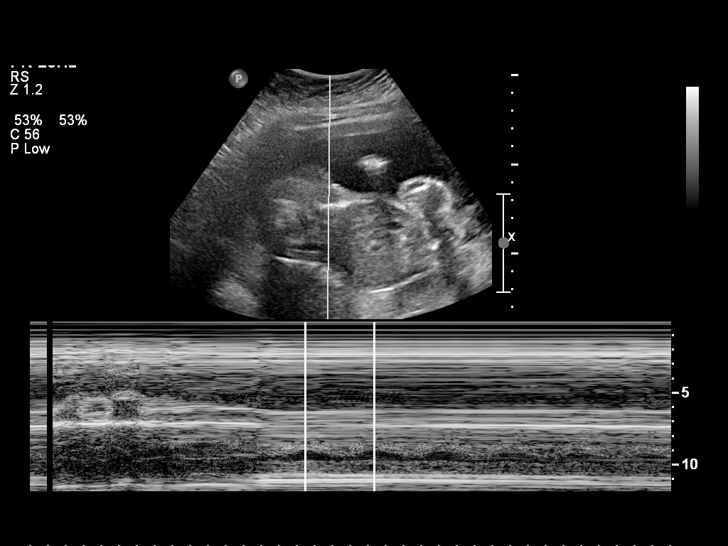
[im 15/22]
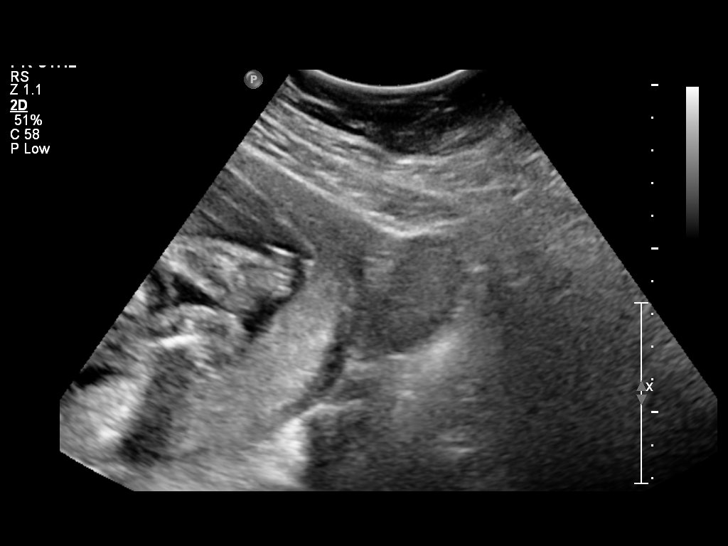
[im 17/22]
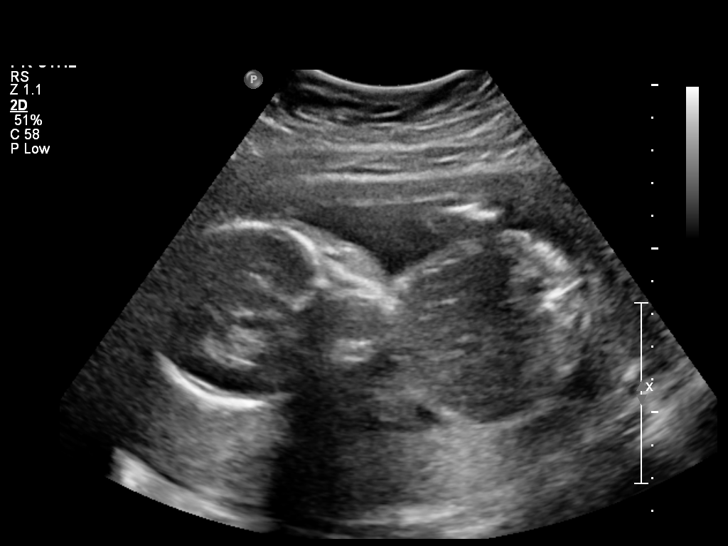
[im 19/22]
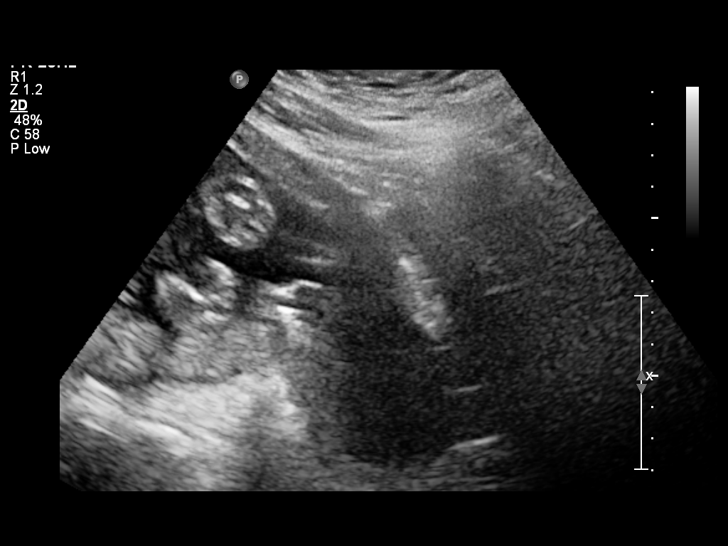
[im 20/22]
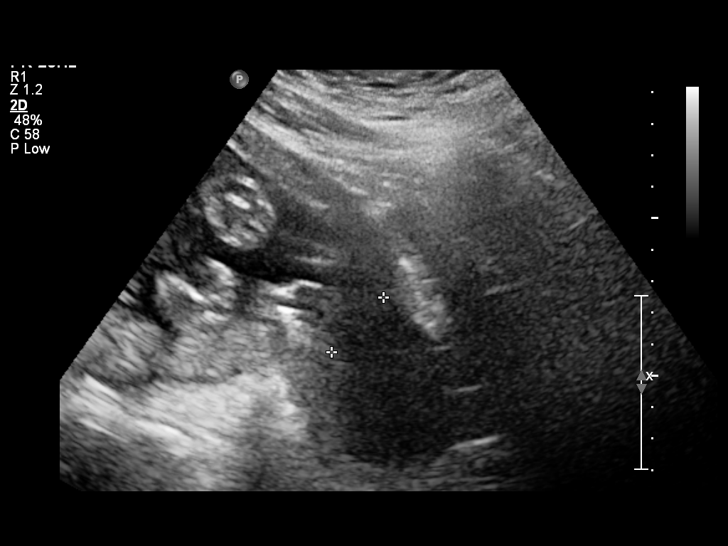
[im 22/22]
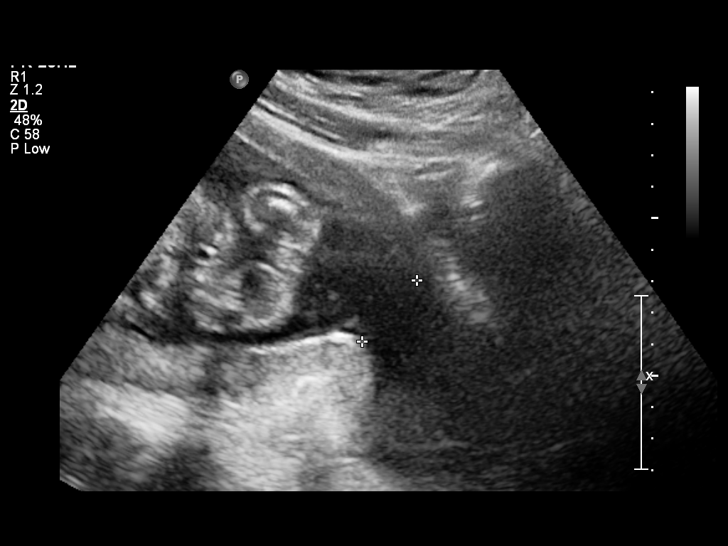

[14 of 22 positions shown; findings below may reference images not displayed]

LIMITED OBSTETRIC ULTRASOUND

Number of Fetuses: 1
Heart Rate: 155 bpm
Movement: Present
Presentation: Breech
Placental Location: Posterior
Previa: Negative
Amniotic Fluid (Subjective): Subjectively normal.

Vertical Pocket 3.9 cm

MATERNAL FINDINGS:
Cervix: The cervix is dilated to 0.9 cm.  Bulging membranes extend
into the vagina.
Uterus/Adnexae:  Within normal limits.
IMPRESSION: 1.  Cervix dilated to 2.9 cm with membranes bulging into the
vagina.
2.  Breech presentation with fetal heart rate of 155.
3.  Subjectively normal amniotic fluid volume.

Recommend followup with non-emergent complete OB 14+ wk US
examination for fetal biometric evaluation and anatomic survey if
not already performed.

## 2013-08-28 ENCOUNTER — Encounter: Payer: Self-pay | Admitting: Obstetrics & Gynecology

## 2013-08-28 ENCOUNTER — Ambulatory Visit: Payer: Medicaid Other | Admitting: Obstetrics & Gynecology

## 2013-09-19 ENCOUNTER — Other Ambulatory Visit: Payer: Self-pay | Admitting: Obstetrics & Gynecology

## 2013-11-17 ENCOUNTER — Ambulatory Visit (INDEPENDENT_AMBULATORY_CARE_PROVIDER_SITE_OTHER): Payer: Medicaid Other | Admitting: Obstetrics & Gynecology

## 2013-11-17 DIAGNOSIS — Z3049 Encounter for surveillance of other contraceptives: Secondary | ICD-10-CM

## 2013-11-17 DIAGNOSIS — T8332XA Displacement of intrauterine contraceptive device, initial encounter: Secondary | ICD-10-CM

## 2013-11-17 DIAGNOSIS — Z3042 Encounter for surveillance of injectable contraceptive: Secondary | ICD-10-CM

## 2013-11-17 DIAGNOSIS — Z30432 Encounter for removal of intrauterine contraceptive device: Secondary | ICD-10-CM

## 2013-11-17 DIAGNOSIS — Z30431 Encounter for routine checking of intrauterine contraceptive device: Secondary | ICD-10-CM

## 2013-11-17 MED ORDER — MEDROXYPROGESTERONE ACETATE 150 MG/ML IM SUSP: 150.0000 mg | INTRAMUSCULAR | Status: DC

## 2013-11-17 NOTE — Progress Notes (Signed)
Subjective:     [name] [name] is a 33 y.o. female here for a routine exam.  Current complaints: Pt states that she no longer feels the string from her IUD.  Pt states that she is having some pain, rating 6 out of 10.  Pt states that she feels it more in her sides.  Pt states that it is a come and go pain.  Personal health questionnaire reviewed: yes.   Gynecologic History No LMP recorded. Contraception: IUD Last Pap: unsure.    Obstetric History OB History  Gravida Para Term Preterm AB SAB TAB Ectopic Multiple Living  2 1  1 1  1   2     # Outcome Date GA Lbr Len/2nd Weight Sex Delivery Anes PTL Lv  2 PRE 05/24/12 7347w3d / 00:31  F LTCS Spinal  Y  1 TAB                The following portions of the patient's history were reviewed and updated as appropriate: allergies, current medications, past family history, past medical history, past social history, past surgical history and problem list.  Review of Systems Pertinent items are noted in HPI.    Objective:   Pelvic U/S w/3-D coronal imaging--IUD in LUS   Pelvic:  IUD removed intact     Assessment:    Malpositioned IUD s/p removal  Plan:     Depo provera Return prn

## 2013-11-18 ENCOUNTER — Encounter: Payer: Self-pay | Admitting: Obstetrics & Gynecology

## 2013-11-18 ENCOUNTER — Ambulatory Visit (INDEPENDENT_AMBULATORY_CARE_PROVIDER_SITE_OTHER): Payer: Medicaid Other | Admitting: *Deleted

## 2013-11-18 VITALS — BP 143/84 | HR 50 | Temp 98.1°F

## 2013-11-18 DIAGNOSIS — Z309 Encounter for contraceptive management, unspecified: Secondary | ICD-10-CM

## 2013-11-18 MED ORDER — MEDROXYPROGESTERONE ACETATE 150 MG/ML IM SUSP
150.0000 mg | Freq: Once | INTRAMUSCULAR | Status: AC
  Administered 2013-11-18: 150 mg via INTRAMUSCULAR

## 2013-11-18 NOTE — Progress Notes (Signed)
Pt is in office for beginning of depo injections.  Pt was seen in office yesterday and had IUD removed.  Injection given in right upper outer quad.  Pt tolerated well.  Pt advised to RTO on 02/09/14 for next injection.

## 2013-11-18 NOTE — Patient Instructions (Signed)

## 2014-01-12 ENCOUNTER — Encounter: Payer: Self-pay | Admitting: Obstetrics & Gynecology

## 2014-02-09 ENCOUNTER — Ambulatory Visit: Payer: Medicaid Other

## 2014-02-11 ENCOUNTER — Ambulatory Visit (INDEPENDENT_AMBULATORY_CARE_PROVIDER_SITE_OTHER): Payer: Medicaid Other | Admitting: *Deleted

## 2014-02-11 VITALS — BP 113/73 | HR 62 | Ht 67.0 in | Wt 230.0 lb

## 2014-02-11 DIAGNOSIS — B373 Candidiasis of vulva and vagina: Secondary | ICD-10-CM

## 2014-02-11 DIAGNOSIS — IMO0001 Reserved for inherently not codable concepts without codable children: Secondary | ICD-10-CM

## 2014-02-11 DIAGNOSIS — B3731 Acute candidiasis of vulva and vagina: Secondary | ICD-10-CM

## 2014-02-11 MED ORDER — TERCONAZOLE 80 MG VA SUPP: 80.0000 mg | Freq: Every day | VAGINAL | Status: DC

## 2014-02-11 MED ORDER — OB COMPLETE PETITE 35-5-1-200 MG PO CAPS: 1.0000 | ORAL_CAPSULE | Freq: Every day | ORAL | Status: DC

## 2014-02-11 MED ORDER — MEDROXYPROGESTERONE ACETATE 150 MG/ML IM SUSP
150.0000 mg | INTRAMUSCULAR | Status: AC
  Administered 2014-02-11 – 2014-05-06 (×2): 150 mg via INTRAMUSCULAR

## 2014-02-11 NOTE — Progress Notes (Signed)
Patient states she is doing well with her depo Provera. Patient is c/o yeast symptom- burning and itching at vaginal. Patient request treatment. Rx for yeast medication Escribed per office protocol. BP 113/73  Pulse 62  Ht 5\' 7"  (1.702 m)  Wt 230 lb (104.327 kg)  BMI 36.01 kg/m2  Breastfeeding? No Administrations This Visit   medroxyPROGESTERone (DEPO-PROVERA) injection 150 mg   Administered Action Dose Route Administered By   02/11/2014 Given 150 mg Intramuscular Elson AreasJane F Oshae Simmering, RN

## 2014-03-12 ENCOUNTER — Ambulatory Visit: Payer: Medicaid Other | Admitting: Obstetrics & Gynecology

## 2014-03-30 ENCOUNTER — Ambulatory Visit: Payer: Medicaid Other | Admitting: Obstetrics & Gynecology

## 2014-05-04 ENCOUNTER — Ambulatory Visit: Payer: Medicaid Other

## 2014-05-05 ENCOUNTER — Ambulatory Visit: Payer: Medicaid Other

## 2014-05-06 ENCOUNTER — Ambulatory Visit (INDEPENDENT_AMBULATORY_CARE_PROVIDER_SITE_OTHER): Payer: Medicaid Other | Admitting: *Deleted

## 2014-05-06 ENCOUNTER — Other Ambulatory Visit: Payer: Self-pay | Admitting: Obstetrics & Gynecology

## 2014-05-06 VITALS — BP 120/80 | HR 74 | Temp 97.7°F | Ht 67.0 in | Wt 231.0 lb

## 2014-05-06 DIAGNOSIS — Z113 Encounter for screening for infections with a predominantly sexual mode of transmission: Secondary | ICD-10-CM

## 2014-05-06 DIAGNOSIS — Z309 Encounter for contraceptive management, unspecified: Secondary | ICD-10-CM

## 2014-05-06 DIAGNOSIS — Z3049 Encounter for surveillance of other contraceptives: Secondary | ICD-10-CM

## 2014-05-06 DIAGNOSIS — Z3042 Encounter for surveillance of injectable contraceptive: Secondary | ICD-10-CM

## 2014-05-06 DIAGNOSIS — Z3202 Encounter for pregnancy test, result negative: Secondary | ICD-10-CM

## 2014-05-06 LAB — POCT URINE PREGNANCY: Preg Test, Ur: NEGATIVE

## 2014-05-06 NOTE — Progress Notes (Signed)
Patient is in the office today for her DEPO Injection. Patient is on time for her Injection. Patient states she had some spotting the first week of July that ended by April 14, 2014. Patient was worried about possible pregnancy. Patient notified that the spotting could have been because she had some thickening of the lining of her uterus and the DEPO was thinning it out. Patient also notified that the spotting could be from a possible infection such as an STD or UTI. Patient reassured she is on time for her injection that the it is highly unlikely that she is pregnant and that if she did become pregnant on the DEPO that the pregnancy should be fine. Patient notified that we could do a UPT today for her reassurance and that per office protocol we could send her urine for culture and GC/CH. Patient states she would like to have those things done. UPT preformed, results were negative. Patient states that she received her last injection in her Right Upper Outer Quadrant and noticed some lower back pain after. Patient wanted to switch sides for the Injection. Patient notified that we are suppose to rotate sides anyway's and to let us know at her next visit if she still had lower back pain after the injection. Patient voiced understanding. Injection given in Left Upper Outer Quadrant. Patient tolerated well. Patient notified to make an appointment with the front for July 28, 2014 for her next injection. Patient states she is due for an annual exam but had to cancel her appointment. Patient notified to tell the front that she needs to reschedule her annual exam. Patient voiced understanding.   BP 120/80  Pulse 74  Temp(Src) 97.7 F (36.5 C)  Ht 5\' 7"  (1.702 m)  Wt 231 lb (104.781 kg)  BMI 36.17 kg/m2  Administrations This Visit   medroxyPROGESTERone (DEPO-PROVERA) injection 150 mg   Administered Action Dose Route Administered By   05/06/2014 Given 150 mg Intramuscular Odessa FlemingKristina M Raahim Shartzer, LPN          Results for orders placed in visit on 05/06/14 (from the past 24 hour(s))  POCT URINE PREGNANCY     Status: None   Collection Time    05/06/14  6:13 PM      Result Value Ref Range   Preg Test, Ur Negative      Orders Placed This Encounter  Procedures  . Urine culture  . GC/Chlamydia Probe Amp  . POCT urine pregnancy

## 2014-05-07 LAB — GC/CHLAMYDIA PROBE AMP
CT Probe RNA: NEGATIVE
GC Probe RNA: NEGATIVE

## 2014-05-07 LAB — URINE CULTURE
COLONY COUNT: NO GROWTH
ORGANISM ID, BACTERIA: NO GROWTH

## 2014-05-21 ENCOUNTER — Telehealth: Payer: Self-pay | Admitting: *Deleted

## 2014-05-21 NOTE — Telephone Encounter (Signed)
Patient called regarding her lab results. 

## 2014-06-18 NOTE — Telephone Encounter (Signed)
No return call- re file.

## 2014-07-28 ENCOUNTER — Encounter: Payer: Self-pay | Admitting: Obstetrics

## 2014-07-28 ENCOUNTER — Ambulatory Visit: Payer: Medicaid Other

## 2014-07-28 ENCOUNTER — Ambulatory Visit (INDEPENDENT_AMBULATORY_CARE_PROVIDER_SITE_OTHER): Payer: Medicaid Other | Admitting: Obstetrics

## 2014-07-28 VITALS — BP 110/75 | HR 73 | Temp 98.0°F | Ht 67.0 in | Wt 238.0 lb

## 2014-07-28 DIAGNOSIS — Z30011 Encounter for initial prescription of contraceptive pills: Secondary | ICD-10-CM

## 2014-07-28 DIAGNOSIS — Z0000 Encounter for general adult medical examination without abnormal findings: Secondary | ICD-10-CM

## 2014-07-28 MED ORDER — LEVONORGESTREL-ETHINYL ESTRAD 0.15-30 MG-MCG PO TABS: 1.0000 | ORAL_TABLET | Freq: Every day | ORAL | Status: DC

## 2014-07-28 NOTE — Progress Notes (Signed)
Subjective:     [name] [name] is a 33 y.o. female here for a routine exam.  Current complaints: Wants to stop Depo Provera and start OCP's so that she can have periods.    Personal health questionnaire:  Is patient Ashkenazi Jewish, have a family history of breast and/or ovarian cancer: no Is there a family history of uterine cancer diagnosed at age < 7050, gastrointestinal cancer, urinary tract cancer, family member who is a Personnel officerLynch syndrome-associated carrier: no Is the patient overweight and hypertensive, family history of diabetes, personal history of gestational diabetes or PCOS: no Is patient over 6455, have PCOS,  family history of premature CHD under age 31, diabetes, smoke, have hypertension or peripheral artery disease:  no At any time, has a partner hit, kicked or otherwise hurt or frightened you?: no Over the past 2 weeks, have you felt down, depressed or hopeless?: no Over the past 2 weeks, have you felt little interest or pleasure in doing things?:no   Gynecologic History No LMP recorded. Patient has had an injection. Contraception: Depo-Provera injections Last Pap: unknown. Results were: normal Last mammogram: n/a. Results were: n/a  Obstetric History OB History  Gravida Para Term Preterm AB SAB TAB Ectopic Multiple Living  2 1  1 1  1   2     # Outcome Date GA Lbr Len/2nd Weight Sex Delivery Anes PTL Lv  2 PRE 05/24/12 7390w3d / 00:31  F LTCS Spinal  Y  1 TAB               Past Medical History  Diagnosis Date  . HPV (human papilloma virus) infection     Past Surgical History  Procedure Laterality Date  . Induced abortion    . Cesarean section  05/24/2012    Procedure: CESAREAN SECTION;  Surgeon: Antionette CharLisa Jackson-Moore, MD;  Location: [facility];  Service: Gynecology;  Laterality: N/A;    Current outpatient prescriptions:medroxyPROGESTERone (DEPO-PROVERA) 150 MG/ML injection, Inject 1 mL (150 mg total) into the muscle every 3 (three) months., Disp: 1 mL, Rfl: 3;   Prenat-FeCbn-FeAspGl-FA-Omega (OB COMPLETE PETITE) 35-5-1-200 MG CAPS, Take 1 tablet by mouth daily., Disp: 30 capsule, Rfl: 11;  levonorgestrel-ethinyl estradiol (NORDETTE) 0.15-30 MG-MCG tablet, Take 1 tablet by mouth daily., Disp: 1 Package, Rfl: 11 Current facility-administered medications:medroxyPROGESTERone (DEPO-PROVERA) injection 150 mg, 150 mg, Intramuscular, Q90 days, Antionette CharLisa Jackson-Moore, MD, 150 mg at 05/06/14 1812 No Known Allergies  History  Substance Use Topics  . Smoking status: Light Tobacco Smoker    Types: Cigarettes  . Smokeless tobacco: Never Used     Comment: maybe 2 cigarettes/month  . Alcohol Use: Yes     Comment: 2-3 times/month    Family History  Problem Relation Age of Onset  . Anesthesia problems Neg Hx   . Other Neg Hx   . Diabetes Mother   . Diabetes Father       Review of Systems  Constitutional: negative for fatigue and weight loss Respiratory: negative for cough and wheezing Cardiovascular: negative for chest pain, fatigue and palpitations Gastrointestinal: negative for abdominal pain and change in bowel habits Musculoskeletal:negative for myalgias Neurological: negative for gait problems and tremors Behavioral/Psych: negative for abusive relationship, depression Endocrine: negative for temperature intolerance   Genitourinary:negative for abnormal menstrual periods, genital lesions, hot flashes, sexual problems and vaginal discharge Integument/breast: negative for breast lump, breast tenderness, nipple discharge and skin lesion(s)    Objective:       BP 110/75  Pulse 73  Temp(Src) 98 F (  36.7 C)  Ht 5\' 7"  (1.702 m)  Wt 238 lb (107.956 kg)  BMI 37.27 kg/m2 General:   alert  Skin:   no rash or abnormalities  Lungs:   clear to auscultation bilaterally  Heart:   regular rate and rhythm, S1, S2 normal, no murmur, click, rub or gallop  Breasts:   normal without suspicious masses, skin or nipple changes or axillary nodes  Abdomen:  normal  findings: no organomegaly, soft, non-tender and no hernia  Pelvis:  External genitalia: normal general appearance Urinary system: urethral meatus normal and bladder without fullness, nontender Vaginal: normal without tenderness, induration or masses Cervix: normal appearance Adnexa: normal bimanual exam Uterus: anteverted and non-tender, normal size   Lab Review Urine pregnancy test Labs reviewed yes Radiologic studies reviewed no    Assessment:    Healthy female exam.    Plan:    Education reviewed: low fat, low cholesterol diet, safe sex/STD prevention, self breast exams, smoking cessation and weight bearing exercise. Contraception: OCP (estrogen/progesterone). Follow up in: 1 year.   Meds ordered this encounter  Medications  . levonorgestrel-ethinyl estradiol (NORDETTE) 0.15-30 MG-MCG tablet    Sig: Take 1 tablet by mouth daily.    Dispense:  1 Package    Refill:  11   No orders of the defined types were placed in this encounter.

## 2014-07-29 LAB — WET PREP BY MOLECULAR PROBE
Candida species: POSITIVE — AB
Gardnerella vaginalis: POSITIVE — AB
TRICHOMONAS VAG: NEGATIVE

## 2014-07-29 LAB — GC/CHLAMYDIA PROBE AMP
CT Probe RNA: NEGATIVE
GC PROBE AMP APTIMA: NEGATIVE

## 2014-07-30 ENCOUNTER — Other Ambulatory Visit: Payer: Self-pay | Admitting: Obstetrics

## 2014-07-30 DIAGNOSIS — B9689 Other specified bacterial agents as the cause of diseases classified elsewhere: Secondary | ICD-10-CM

## 2014-07-30 DIAGNOSIS — B373 Candidiasis of vulva and vagina: Secondary | ICD-10-CM

## 2014-07-30 DIAGNOSIS — B3731 Acute candidiasis of vulva and vagina: Secondary | ICD-10-CM

## 2014-07-30 DIAGNOSIS — N760 Acute vaginitis: Principal | ICD-10-CM

## 2014-07-30 LAB — PAP IG AND HPV HIGH-RISK: HPV DNA High Risk: NOT DETECTED

## 2014-07-30 MED ORDER — METRONIDAZOLE 500 MG PO TABS: 500.0000 mg | ORAL_TABLET | Freq: Two times a day (BID) | ORAL | Status: DC

## 2014-07-30 MED ORDER — FLUCONAZOLE 150 MG PO TABS: 150.0000 mg | ORAL_TABLET | Freq: Once | ORAL | Status: DC

## 2014-08-10 ENCOUNTER — Encounter: Payer: Self-pay | Admitting: Obstetrics

## 2014-09-23 ENCOUNTER — Ambulatory Visit: Payer: Medicaid Other | Admitting: Obstetrics

## 2014-10-05 ENCOUNTER — Encounter: Payer: Self-pay | Admitting: *Deleted

## 2014-10-06 ENCOUNTER — Encounter: Payer: Self-pay | Admitting: Obstetrics & Gynecology

## 2015-07-15 ENCOUNTER — Other Ambulatory Visit: Payer: Self-pay | Admitting: *Deleted

## 2015-07-15 MED ORDER — OB COMPLETE PETITE 35-5-1-200 MG PO CAPS: 1.0000 | ORAL_CAPSULE | Freq: Every day | ORAL | Status: DC

## 2015-07-19 ENCOUNTER — Encounter: Payer: Self-pay | Admitting: *Deleted

## 2015-07-27 ENCOUNTER — Telehealth: Payer: Self-pay | Admitting: Obstetrics

## 2015-07-27 ENCOUNTER — Ambulatory Visit: Payer: Medicaid Other | Admitting: Obstetrics

## 2015-07-30 NOTE — Telephone Encounter (Signed)
Still unable to reach patient.

## 2015-08-16 ENCOUNTER — Other Ambulatory Visit: Payer: Self-pay | Admitting: *Deleted

## 2015-08-16 ENCOUNTER — Other Ambulatory Visit: Payer: Self-pay | Admitting: Obstetrics

## 2015-08-16 DIAGNOSIS — Z30011 Encounter for initial prescription of contraceptive pills: Secondary | ICD-10-CM

## 2015-08-16 MED ORDER — LEVONORGESTREL-ETHINYL ESTRAD 0.15-30 MG-MCG PO TABS: 1.0000 | ORAL_TABLET | Freq: Every day | ORAL | Status: DC

## 2015-09-27 ENCOUNTER — Ambulatory Visit: Payer: Medicaid Other | Admitting: Obstetrics

## 2015-10-12 ENCOUNTER — Ambulatory Visit: Payer: Medicaid Other | Admitting: Certified Nurse Midwife

## 2015-10-18 ENCOUNTER — Telehealth: Payer: Self-pay | Admitting: Certified Nurse Midwife

## 2015-10-19 ENCOUNTER — Encounter: Payer: Self-pay | Admitting: Certified Nurse Midwife

## 2015-10-19 ENCOUNTER — Ambulatory Visit (INDEPENDENT_AMBULATORY_CARE_PROVIDER_SITE_OTHER): Payer: Medicaid Other | Admitting: Certified Nurse Midwife

## 2015-10-19 VITALS — BP 119/77 | HR 78 | Temp 97.9°F | Wt 243.0 lb

## 2015-10-19 DIAGNOSIS — Z01419 Encounter for gynecological examination (general) (routine) without abnormal findings: Secondary | ICD-10-CM

## 2015-10-19 DIAGNOSIS — Z3041 Encounter for surveillance of contraceptive pills: Secondary | ICD-10-CM

## 2015-10-19 DIAGNOSIS — Z0000 Encounter for general adult medical examination without abnormal findings: Secondary | ICD-10-CM | POA: Diagnosis not present

## 2015-10-19 MED ORDER — LEVONORGESTREL-ETHINYL ESTRAD 0.15-30 MG-MCG PO TABS: 1.0000 | ORAL_TABLET | Freq: Every day | ORAL | Status: AC

## 2015-10-19 NOTE — Progress Notes (Signed)
Patient ID: [name] [name], female   DOB: 04/01/1981, 35 y.o.   MRN: [id number]    Subjective:        [name] [name] is a 35 y.o. female here for a routine exam.  Current complaints: none.  Currently sexually active.  Desires to continue with OCPs.  Declines blood STD screening today.       Personal health questionnaire:  Is patient Ashkenazi Jewish, have a family history of breast and/or ovarian cancer: no Is there a family history of uterine cancer diagnosed at age < 7050, gastrointestinal cancer, urinary tract cancer, family member who is a Personnel officerLynch syndrome-associated carrier: no Is the patient overweight and hypertensive, family history of diabetes, personal history of gestational diabetes, preeclampsia or PCOS: yes Is patient over 5755, have PCOS,  family history of premature CHD under age 33, diabetes, smoke, have hypertension or peripheral artery disease:  Yes, MI mothers side, DM on both sides At any time, has a partner hit, kicked or otherwise hurt or frightened you?: no Over the past 2 weeks, have you felt down, depressed or hopeless?: no Over the past 2 weeks, have you felt little interest or pleasure in doing things?:no   Gynecologic History Patient's last menstrual period was 10/15/2015. Contraception: OCP (estrogen/progesterone) Last Pap: 2015. Results were: normal Last mammogram: N/A.   Obstetric History OB History  Gravida Para Term Preterm AB SAB TAB Ectopic Multiple Living  2 1  1 1  1   2     # Outcome Date GA Lbr Len/2nd Weight Sex Delivery Anes PTL Lv  2 Preterm 05/24/12 602w3d / 00:31  F CS-LTranv Spinal  Y  1 TAB               Past Medical History  Diagnosis Date  . HPV (human papilloma virus) infection     Past Surgical History  Procedure Laterality Date  . Induced abortion    . Cesarean section  05/24/2012    Procedure: CESAREAN SECTION;  Surgeon: Antionette CharLisa Jackson-Moore, MD;  Location: [facility];  Service: Gynecology;  Laterality: N/A;     Current outpatient  prescriptions:  .  levonorgestrel-ethinyl estradiol (LEVORA 0.15/30, 28,) 0.15-30 MG-MCG tablet, Take 1 tablet by mouth daily., Disp: 84 tablet, Rfl: 3 No Known Allergies  Social History  Substance Use Topics  . Smoking status: Light Tobacco Smoker    Types: Cigarettes  . Smokeless tobacco: Never Used     Comment: maybe 2 cigarettes/month  . Alcohol Use: 0.0 oz/week    0 Standard drinks or equivalent per week     Comment: 2-3 times/month    Family History  Problem Relation Age of Onset  . Anesthesia problems Neg Hx   . Other Neg Hx   . Diabetes Mother   . Diabetes Father       Review of Systems  Constitutional: negative for fatigue and weight loss Respiratory: negative for cough and wheezing Cardiovascular: negative for chest pain, fatigue and palpitations Gastrointestinal: negative for abdominal pain and change in bowel habits Musculoskeletal:negative for myalgias Neurological: negative for gait problems and tremors Behavioral/Psych: negative for abusive relationship, depression Endocrine: negative for temperature intolerance   Genitourinary:negative for abnormal menstrual periods, genital lesions, hot flashes, sexual problems and vaginal discharge Integument/breast: negative for breast lump, breast tenderness, nipple discharge and skin lesion(s)    Objective:       BP 119/77 mmHg  Pulse 78  Temp(Src) 97.9 F (36.6 C)  Wt 243 lb (110.224 kg)  LMP 10/15/2015  General:   alert  Skin:   no rash or abnormalities  Lungs:   clear to auscultation bilaterally  Heart:   regular rate and rhythm, S1, S2 normal, no murmur, click, rub or gallop  Breasts:   normal without suspicious masses, skin or nipple changes or axillary nodes  Abdomen:  normal findings: no organomegaly, soft, non-tender and no hernia  Pelvis:  External genitalia: normal general appearance Urinary system: urethral meatus normal and bladder without fullness, nontender Vaginal: normal without tenderness,  induration or masses Cervix: normal appearance Adnexa: normal bimanual exam Uterus: anteverted and non-tender, normal size   Lab Review Urine pregnancy test Labs reviewed yes Radiologic studies reviewed no  50% of 30 min visit spent on counseling and coordination of care.   Assessment:    Healthy female exam.   STD screening exam: limited  Contraception management  Obesity  Plan:    Education reviewed: depression evaluation, low fat, low cholesterol diet, safe sex/STD prevention, self breast exams, skin cancer screening and weight bearing exercise. Contraception: OCP (estrogen/progesterone). Follow up in: 1 years.   Meds ordered this encounter  Medications  . levonorgestrel-ethinyl estradiol (LEVORA 0.15/30, 28,) 0.15-30 MG-MCG tablet    Sig: Take 1 tablet by mouth daily.    Dispense:  84 tablet    Refill:  3    **Patient requests 90 days supply**   Orders Placed This Encounter  Procedures  . SureSwab, Vaginosis/Vaginitis Plus

## 2015-10-19 NOTE — Telephone Encounter (Signed)
10/19/15 - Patient seen today for annual exam. brm

## 2015-10-21 LAB — PAP, TP IMAGING W/ HPV RNA, RFLX HPV TYPE 16,18/45: HPV MRNA, HIGH RISK: NOT DETECTED

## 2015-10-23 LAB — SURESWAB, VAGINOSIS/VAGINITIS PLUS
Atopobium vaginae: NOT DETECTED Log (cells/mL)
C. GLABRATA, DNA: NOT DETECTED
C. PARAPSILOSIS, DNA: NOT DETECTED
C. albicans, DNA: NOT DETECTED
C. trachomatis RNA, TMA: DETECTED — AB
C. tropicalis, DNA: NOT DETECTED
Gardnerella vaginalis: 7.6 Log (cells/mL)
LACTOBACILLUS SPECIES: NOT DETECTED Log (cells/mL)
MEGASPHAERA SPECIES: NOT DETECTED Log (cells/mL)
N. GONORRHOEAE RNA, TMA: NOT DETECTED
T. vaginalis RNA, QL TMA: NOT DETECTED

## 2015-10-27 ENCOUNTER — Other Ambulatory Visit: Payer: Self-pay | Admitting: Certified Nurse Midwife

## 2015-10-27 DIAGNOSIS — A749 Chlamydial infection, unspecified: Secondary | ICD-10-CM

## 2015-10-27 DIAGNOSIS — B9689 Other specified bacterial agents as the cause of diseases classified elsewhere: Secondary | ICD-10-CM

## 2015-10-27 DIAGNOSIS — N760 Acute vaginitis: Secondary | ICD-10-CM

## 2015-10-27 MED ORDER — TINIDAZOLE 500 MG PO TABS: 2.0000 g | ORAL_TABLET | Freq: Every day | ORAL | Status: DC

## 2015-10-27 MED ORDER — AZITHROMYCIN 250 MG PO TABS: ORAL_TABLET | ORAL | Status: DC

## 2015-10-29 ENCOUNTER — Other Ambulatory Visit: Payer: Self-pay | Admitting: *Deleted

## 2015-10-29 DIAGNOSIS — A749 Chlamydial infection, unspecified: Secondary | ICD-10-CM

## 2015-10-29 DIAGNOSIS — N760 Acute vaginitis: Secondary | ICD-10-CM

## 2015-10-29 DIAGNOSIS — B9689 Other specified bacterial agents as the cause of diseases classified elsewhere: Secondary | ICD-10-CM

## 2015-10-29 MED ORDER — AZITHROMYCIN 250 MG PO TABS: ORAL_TABLET | ORAL | Status: DC

## 2015-10-29 MED ORDER — TINIDAZOLE 500 MG PO TABS: 2.0000 g | ORAL_TABLET | Freq: Every day | ORAL | Status: AC

## 2015-10-30 ENCOUNTER — Other Ambulatory Visit: Payer: Self-pay | Admitting: Certified Nurse Midwife

## 2015-12-08 ENCOUNTER — Ambulatory Visit (INDEPENDENT_AMBULATORY_CARE_PROVIDER_SITE_OTHER): Payer: Medicaid Other | Admitting: Certified Nurse Midwife

## 2015-12-08 VITALS — BP 101/66 | HR 83 | Wt 246.0 lb

## 2015-12-08 DIAGNOSIS — Z113 Encounter for screening for infections with a predominantly sexual mode of transmission: Secondary | ICD-10-CM

## 2015-12-08 DIAGNOSIS — L739 Follicular disorder, unspecified: Secondary | ICD-10-CM | POA: Diagnosis not present

## 2015-12-08 DIAGNOSIS — Z8619 Personal history of other infectious and parasitic diseases: Secondary | ICD-10-CM

## 2015-12-08 MED ORDER — HYDROCORTISONE 1 % EX CREA: TOPICAL_CREAM | CUTANEOUS | Status: DC

## 2015-12-09 ENCOUNTER — Encounter: Payer: Self-pay | Admitting: Certified Nurse Midwife

## 2015-12-09 NOTE — Progress Notes (Signed)
Patient ID: JENNIFFER VESSELS, female   DOB: 20-Aug-1981, 35 y.o.   MRN: [id number]  Chief Complaint  Patient presents with  . Follow-up    sureswab for toc, hx +CH    HPI OSIRIS CHARLES is a 35 y.o. female.  Here for TOC from recent Chlamydia infection.  States that she and her partner were treated for [facility].  Denies any current vaginal discharge.  Is taking her OCPs as prescribed, same time every day and is happy with them.  Discussed at length shaving of pubic hair, encouraged trimming instead.  Patient verbalized understanding.   HPI  Past Medical History  Diagnosis Date  . HPV (human papilloma virus) infection     Past Surgical History  Procedure Laterality Date  . Induced abortion    . Cesarean section  05/24/2012    Procedure: CESAREAN SECTION;  Surgeon: Antionette Char, MD;  Location: [facility];  Service: Gynecology;  Laterality: N/A;    Family History  Problem Relation Age of Onset  . Anesthesia problems Neg Hx   . Other Neg Hx   . Diabetes Mother   . Diabetes Father     Social History Social History  Substance Use Topics  . Smoking status: Light Tobacco Smoker    Types: Cigarettes  . Smokeless tobacco: Never Used     Comment: maybe 2 cigarettes/month  . Alcohol Use: 0.0 oz/week    0 Standard drinks or equivalent per week     Comment: 2-3 times/month    No Known Allergies  Current Outpatient Prescriptions  Medication Sig Dispense Refill  . levonorgestrel-ethinyl estradiol (LEVORA 0.15/30, 28,) 0.15-30 MG-MCG tablet Take 1 tablet by mouth daily. 84 tablet 3  . hydrocortisone cream 1 % Apply to affected area 2 times daily 30 g 1   No current facility-administered medications for this visit.    Review of Systems Review of Systems Constitutional: negative for fatigue and weight loss Respiratory: negative for cough and wheezing Cardiovascular: negative for chest pain, fatigue and palpitations Gastrointestinal: negative for abdominal pain and change in bowel  habits Genitourinary:negative Integument/breast: negative for nipple discharge Musculoskeletal:negative for myalgias Neurological: negative for gait problems and tremors Behavioral/Psych: negative for abusive relationship, depression Endocrine: negative for temperature intolerance     Blood pressure 101/66, pulse 83, weight 246 lb (111.585 kg), last menstrual period 11/10/2015.  Physical Exam Physical Exam General:   alert  Skin:   no rash or abnormalities  Lungs:   clear to auscultation bilaterally  Heart:   regular rate and rhythm, S1, S2 normal, no murmur, click, rub or gallop  Breasts:   deferred  Abdomen:  normal findings: no organomegaly, soft, non-tender and no hernia  Pelvis:  External genitalia: normal general appearance, + red raised lesions, no pustules Urinary system: urethral meatus normal and bladder without fullness, nontender Vaginal: normal without tenderness, induration or masses Cervix: no CMT Adnexa: normal bimanual exam Uterus: anteverted and non-tender, normal size    50% of 15 min visit spent on counseling and coordination of care.   Data Reviewed Previous medical hx, meds  Assessment     TOC from recent chlamydia infection Contraception surveillance.    Folliculitis from shaving.     Plan    Orders Placed This Encounter  Procedures  . SureSwab, Vaginosis/Vaginitis Plus   Meds ordered this encounter  Medications  . hydrocortisone cream 1 %    Sig: Apply to affected area 2 times daily    Dispense:  30 g  Refill:  1    Possible management options include: LARK Follow up as needed.

## 2015-12-12 LAB — SURESWAB, VAGINOSIS/VAGINITIS PLUS
ATOPOBIUM VAGINAE: NOT DETECTED Log (cells/mL)
C. GLABRATA, DNA: NOT DETECTED
C. TRACHOMATIS RNA, TMA: NOT DETECTED
C. albicans, DNA: NOT DETECTED
C. parapsilosis, DNA: NOT DETECTED
C. tropicalis, DNA: NOT DETECTED
GARDNERELLA VAGINALIS: NOT DETECTED Log (cells/mL)
LACTOBACILLUS SPECIES: NOT DETECTED Log (cells/mL)
MEGASPHAERA SPECIES: NOT DETECTED Log (cells/mL)
N. GONORRHOEAE RNA, TMA: NOT DETECTED
T. vaginalis RNA, QL TMA: NOT DETECTED

## 2016-05-12 ENCOUNTER — Ambulatory Visit (INDEPENDENT_AMBULATORY_CARE_PROVIDER_SITE_OTHER): Payer: Medicaid Other | Admitting: Diagnostic Neuroimaging

## 2016-05-12 ENCOUNTER — Encounter: Payer: Self-pay | Admitting: Diagnostic Neuroimaging

## 2016-05-12 VITALS — BP 115/75 | HR 60 | Ht 67.0 in | Wt 245.2 lb

## 2016-05-12 DIAGNOSIS — H53149 Visual discomfort, unspecified: Secondary | ICD-10-CM

## 2016-05-12 DIAGNOSIS — H5711 Ocular pain, right eye: Secondary | ICD-10-CM

## 2016-05-12 DIAGNOSIS — H02401 Unspecified ptosis of right eyelid: Secondary | ICD-10-CM | POA: Diagnosis not present

## 2016-05-12 DIAGNOSIS — L03213 Periorbital cellulitis: Secondary | ICD-10-CM

## 2016-05-12 DIAGNOSIS — H5319 Other subjective visual disturbances: Secondary | ICD-10-CM | POA: Diagnosis not present

## 2016-05-12 NOTE — Progress Notes (Signed)
[facility]  PATIENT: [name] [name] DOB: Apr 17, 1981  REFERRING CLINICIAN: My Le HISTORY FROM: patient  REASON FOR VISIT: new consult (urgent)   HISTORICAL  CHIEF COMPLAINT:  Chief Complaint  Patient presents with  . Other    rm 6, New Pt, Ptosis of R eye, "my right eye appears smaller than the left, dry, irriated by air/light which causes it to tear x 1 month"    HISTORY OF PRESENT ILLNESS:   35 year old right-handed, ambidextrous female here for evaluation of right-sided ptosis. Early July 2017 patient slept with her contact lenses in for 3 days. Soon after she developed irritation in her right greater than left eye. Patient went to ophthalmologist in [place] where she was staying and was diagnosed with keratoconjunctivitis and treated with eyedrops. Around this time she also noticed some slight right eyelid drooping. Patient went to local optometrist in [place] and was diagnosed with preseptal cellulitis of the right eye. She was treated with antibiotics and eye cream. This was started on 05/06/2016.  Patient right eye ptosis has continued. However her dry irritated and photosensitive right eye has slightly improved. Patient denies a problems with her left side of her face, left eye, arms or legs. No headache slurred speech or trouble talking. No accidents or trauma.    REVIEW OF SYSTEMS: Full 14 system review of systems performed and negative with exception of: Sleepiness. Otherwise negative.  ALLERGIES: No Known Allergies  HOME MEDICATIONS: Outpatient Medications Prior to Visit  Medication Sig Dispense Refill  . hydrocortisone cream 1 % Apply to affected area 2 times daily 30 g 1  . levonorgestrel-ethinyl estradiol (LEVORA 0.15/30, 28,) 0.15-30 MG-MCG tablet Take 1 tablet by mouth daily. (Patient not taking: Reported on 05/12/2016) 84 tablet 3   No facility-administered medications prior to visit.     PAST MEDICAL HISTORY: Past Medical History:    Diagnosis Date  . HPV (human papilloma virus) infection     PAST SURGICAL HISTORY: Past Surgical History:  Procedure Laterality Date  . CESAREAN SECTION  05/24/2012   Procedure: CESAREAN SECTION;  Surgeon: Antionette Char, MD;  Location: [facility];  Service: Gynecology;  Laterality: N/A;  . INDUCED ABORTION      FAMILY HISTORY: Family History  Problem Relation Age of Onset  . Diabetes Mother   . Diabetes Father   . Anesthesia problems Neg Hx   . Other Neg Hx     SOCIAL HISTORY:  Social History   Social History  . Marital status: Single    Spouse name: N/A  . Number of children: 1  . Years of education: 60   Occupational History  .      self employed   Social History Main Topics  . Smoking status: Light Tobacco Smoker    Types: Cigarettes  . Smokeless tobacco: Never Used     Comment: maybe 2 cigarettes/month  . Alcohol use 0.0 oz/week     Comment: 2-3 times/month  . Drug use: No  . Sexual activity: Yes    Partners: Male    Birth control/ protection: Pill   Other Topics Concern  . Not on file   Social History Narrative   Lives with daughter   Caffeine- occas coffee     PHYSICAL EXAM  GENERAL EXAM/CONSTITUTIONAL: Vitals:  Vitals:   05/12/16 0833  BP: 115/75  Pulse: 60  Weight: 245 lb 3.2 oz (111.2 kg)  Height: 5\' 7"  (1.702 m)     Body mass index is 38.4 kg/m.  Visual Acuity Screening   Right eye Left eye Both eyes  Without correction:     With correction: 20/50 20/30      Patient is in no distress; well developed, nourished and groomed; neck is supple  CARDIOVASCULAR:  Examination of carotid arteries is normal; no carotid bruits  Regular rate and rhythm, no murmurs  Examination of peripheral vascular system by observation and palpation is normal  EYES:  Ophthalmoscopic exam of optic discs and posterior segments is normal; no papilledema or hemorrhages  MUSCULOSKELETAL:  Gait, strength, tone, movements noted in Neurologic exam  below  NEUROLOGIC: MENTAL STATUS:  No flowsheet data found.  awake, alert, oriented to person, place and time  recent and remote memory intact  normal attention and concentration  language fluent, comprehension intact, naming intact,   fund of knowledge appropriate  CRANIAL NERVE:   2nd - no papilledema on fundoscopic exam  2nd, 3rd, 4th, 6th - pupils equal and reactive to light, visual fields full to confrontation, extraocular muscles intact, no nystagmus; MILD RIGHT PTOSIS AT REST; NO FATIGUABLE WEAKNESS OR DOUBLE VISION AFTER 1 MINUTE UPGAZE  5th - facial sensation symmetric  7th - facial strength symmetric  8th - hearing intact  9th - palate elevates symmetrically, uvula midline  11th - shoulder shrug symmetric  12th - tongue protrusion midline  MOTOR:   normal bulk and tone, full strength in the BUE, BLE  SENSORY:   normal and symmetric to light touch, temperature, vibration  COORDINATION:   finger-nose-finger, fine finger movements normal  REFLEXES:   deep tendon reflexes present and symmetric  GAIT/STATION:   narrow based gait; able to walk tandem; romberg is negative    DIAGNOSTIC DATA (LABS, IMAGING, TESTING) - I reviewed patient records, labs, notes, testing and imaging myself where available.  Lab Results  Component Value Date   WBC 11.8 (H) 05/27/2012   HGB 9.4 (L) 05/27/2012   HCT 28.7 (L) 05/27/2012   MCV 84.9 05/27/2012   PLT 265 05/27/2012   No results found for: NA, K, CL, CO2, GLUCOSE, BUN, CREATININE, CALCIUM, PROT, ALBUMIN, AST, ALT, ALKPHOS, BILITOT, GFRNONAA, GFRAA No results found for: CHOL, HDL, LDLCALC, LDLDIRECT, TRIG, CHOLHDL No results found for: ZOXW9U No results found for: VITAMINB12 No results found for: TSH     ASSESSMENT AND PLAN  35 y.o. year old female here with New onset right ptosis in the setting of right eye irritation, possibly triggered by sleeping with contact lenses in for 3 days, then developing  keratoconjunctivitis and preseptal so let us of the right eye. Suspect mechanical / infectious cause of right ptosis. Will check additional testing to rule out neurologic etiologies.  Dx:  1. Acquired ptosis of right eyelid   2. Acute right eye pain   3. Photophobia   4. Preseptal cellulitis of right eye      PLAN: - additional workup - continue antibiotics and eye drops  Orders Placed This Encounter  Procedures  . MR Brain W Wo Contrast  . MR Orbits WO/W Cm  . MR MRA HEAD WO CONTRAST  . CBC with Differential/Platelet  . Comprehensive metabolic panel  . TSH  . Vitamin B12  . Hemoglobin A1c  . Acetylcholine Receptor, Binding   Return in about 6 weeks (around 06/23/2016).   I reviewed labs, notes, records myself. I summarized findings and reviewed with patient, for this high risk condition (new onset right ptosis) requiring high complexity decision making.     Suanne Marker, MD  05/12/2016, 9:20 AM Certified in Neurology, Neurophysiology and Neuroimaging  [facility] [address] [place], [place] [address] [phone]

## 2016-05-12 NOTE — Patient Instructions (Signed)
Thank you for coming to see [place] at [facility]. I hope we have been able to provide you high quality care today.  You may receive a patient satisfaction survey over the next few weeks. We would appreciate your feedback and comments so that we may continue to improve ourselves and the health of our patients.  - I will check MRI scans and labs  - continue antibiotics and eye drops   ~~~~~~~~~~~~~~~~~~~~~~~~~~~~~~~~~~~~~~~~~~~~~~~~~~~~~~~~~~~~~~~~~  DR. Marvelous Woolford'S GUIDE TO HAPPY AND HEALTHY LIVING These are some of my general health and wellness recommendations. Some of them may apply to you better than others. Please use common sense as you try these suggestions and feel free to ask me any questions.   ACTIVITY/FITNESS Mental, social, emotional and physical stimulation are very important for brain and body health. Try learning a new activity (arts, music, language, sports, games).  Keep moving your body to the best of your abilities. You can do this at home, inside or outside, the park, community center, gym or anywhere you like. Consider a physical therapist or personal trainer to get started. Consider the app Sworkit. Fitness trackers such as smart-watches, smart-phones or Fitbits can help as well.   NUTRITION Eat more plants: colorful vegetables, nuts, seeds and berries.  Eat less sugar, salt, preservatives and processed foods.  Avoid toxins such as cigarettes and alcohol.  Drink water when you are thirsty. Warm water with a slice of lemon is an excellent morning drink to start the day.  Consider these websites for more information The Nutrition Source (https://www.henry-hernandez.biz/) Precision Nutrition (WindowBlog.ch)   RELAXATION Consider practicing mindfulness meditation or other relaxation techniques such as deep breathing, prayer, yoga, tai chi, massage. See website mindful.org or the apps Headspace or Calm to  help get started.   SLEEP Try to get at least 7-8+ hours sleep per day. Regular exercise and reduced caffeine will help you sleep better. Practice good sleep hygeine techniques. See website sleep.org for more information.   PLANNING Prepare estate planning, living will, healthcare POA documents. Sometimes this is best planned with the help of an attorney. Theconversationproject.org and agingwithdignity.org are excellent resources.

## 2016-05-15 LAB — COMPREHENSIVE METABOLIC PANEL
ALBUMIN: 4.5 g/dL (ref 3.5–5.5)
ALK PHOS: 75 IU/L (ref 39–117)
ALT: 47 IU/L — ABNORMAL HIGH (ref 0–32)
AST: 19 IU/L (ref 0–40)
Albumin/Globulin Ratio: 1.6 (ref 1.2–2.2)
BUN/Creatinine Ratio: 16 (ref 9–23)
BUN: 10 mg/dL (ref 6–20)
Bilirubin Total: 0.2 mg/dL (ref 0.0–1.2)
CALCIUM: 9.8 mg/dL (ref 8.7–10.2)
CO2: 24 mmol/L (ref 18–29)
CREATININE: 0.63 mg/dL (ref 0.57–1.00)
Chloride: 100 mmol/L (ref 96–106)
GFR calc Af Amer: 137 mL/min/{1.73_m2} (ref >59)
GFR, EST NON AFRICAN AMERICAN: 119 mL/min/{1.73_m2} (ref >59)
GLOBULIN, TOTAL: 2.8 g/dL (ref 1.5–4.5)
GLUCOSE: 103 mg/dL — AB (ref 65–99)
Potassium: 5.5 mmol/L — ABNORMAL HIGH (ref 3.5–5.2)
SODIUM: 139 mmol/L (ref 134–144)
Total Protein: 7.3 g/dL (ref 6.0–8.5)

## 2016-05-15 LAB — CBC WITH DIFFERENTIAL/PLATELET
BASOS: 0 %
Basophils Absolute: 0 10*3/uL (ref 0.0–0.2)
EOS (ABSOLUTE): 0.2 10*3/uL (ref 0.0–0.4)
EOS: 3 %
HEMATOCRIT: 43.2 % (ref 34.0–46.6)
Hemoglobin: 13.8 g/dL (ref 11.1–15.9)
IMMATURE GRANS (ABS): 0 10*3/uL (ref 0.0–0.1)
IMMATURE GRANULOCYTES: 0 %
LYMPHS: 33 %
Lymphocytes Absolute: 2.1 10*3/uL (ref 0.7–3.1)
MCH: 29.6 pg (ref 26.6–33.0)
MCHC: 31.9 g/dL (ref 31.5–35.7)
MCV: 93 fL (ref 79–97)
Monocytes Absolute: 0.5 10*3/uL (ref 0.1–0.9)
Monocytes: 8 %
NEUTROS PCT: 56 %
Neutrophils Absolute: 3.5 10*3/uL (ref 1.4–7.0)
PLATELETS: 226 10*3/uL (ref 150–379)
RBC: 4.66 x10E6/uL (ref 3.77–5.28)
RDW: 15 % (ref 12.3–15.4)
WBC: 6.3 10*3/uL (ref 3.4–10.8)

## 2016-05-15 LAB — HEMOGLOBIN A1C
ESTIMATED AVERAGE GLUCOSE: 111 mg/dL
Hgb A1c MFr Bld: 5.5 % (ref 4.8–5.6)

## 2016-05-15 LAB — TSH: TSH: 1.67 u[IU]/mL (ref 0.450–4.500)

## 2016-05-15 LAB — ACETYLCHOLINE RECEPTOR, BINDING

## 2016-05-15 LAB — VITAMIN B12: VITAMIN B 12: 471 pg/mL (ref 211–946)

## 2016-05-16 ENCOUNTER — Telehealth: Payer: Self-pay | Admitting: *Deleted

## 2016-05-16 NOTE — Telephone Encounter (Signed)
LVM informing patient, per Dr Marjory LiesPenumalli, her lab results are unremarkable. Her potassium, glucose and liver functions are borderline, and he recommends she follow up with her PCP to recheck. Advised Dr Marjory LiesPenumalli will continue with her current treatment plan; she is to continue with eye drops and antibiotic and will get a call when her MRI is complete and resulted. Left name, number for questions.

## 2016-06-15 ENCOUNTER — Other Ambulatory Visit: Payer: Self-pay | Admitting: Diagnostic Neuroimaging

## 2016-06-19 ENCOUNTER — Inpatient Hospital Stay: Admission: RE | Admit: 2016-06-19 | Payer: Medicaid Other | Source: Ambulatory Visit

## 2016-06-19 ENCOUNTER — Other Ambulatory Visit: Payer: Medicaid Other

## 2016-06-20 ENCOUNTER — Encounter: Payer: Self-pay | Admitting: Diagnostic Neuroimaging

## 2016-06-20 ENCOUNTER — Ambulatory Visit (INDEPENDENT_AMBULATORY_CARE_PROVIDER_SITE_OTHER): Payer: Medicaid Other | Admitting: Diagnostic Neuroimaging

## 2016-06-20 VITALS — BP 113/80 | HR 65 | Wt 238.0 lb

## 2016-06-20 DIAGNOSIS — H02401 Unspecified ptosis of right eyelid: Secondary | ICD-10-CM | POA: Diagnosis not present

## 2016-06-20 DIAGNOSIS — L03213 Periorbital cellulitis: Secondary | ICD-10-CM

## 2016-06-20 NOTE — Progress Notes (Signed)
[facility]  PATIENT: [name] [name] DOB: 1980/11/23  REFERRING CLINICIAN: My Le HISTORY FROM: patient  REASON FOR VISIT: follow up   HISTORICAL  CHIEF COMPLAINT:  Chief Complaint  Patient presents with  . Other    rm 7, Ptosis of right eyelid, "cancelled MRI yesterday- had to take dgytr to dr; plans to reschedule today for this Sat; notices r upper eyelid drooping more with lack of sleep, stress"  . Follow-up    6 week    HISTORY OF PRESENT ILLNESS:   UPDATE 06/20/16: Since last visit, sxs in right eye are slowly getting better. No new symptoms. MRI, MRA is scheduled for this Saturday.  PRIOR HPI (05/12/16): 35 year old right-handed, ambidextrous female here for evaluation of right-sided ptosis. Early July 2017 patient slept with her contact lenses in for 3 days. Soon after she developed irritation in her right greater than left eye. Patient went to ophthalmologist in [place] where she was staying and was diagnosed with keratoconjunctivitis and treated with eyedrops. Around this time she also noticed some slight right eyelid drooping. Patient went to local optometrist in [place] and was diagnosed with preseptal cellulitis of the right eye. She was treated with antibiotics and eye cream. This was started on 05/06/2016. Patient right eye ptosis has continued. However her dry irritated and photosensitive right eye has slightly improved. Patient denies a problems with her left side of her face, left eye, arms or legs. No headache slurred speech or trouble talking. No accidents or trauma.    REVIEW OF SYSTEMS: Full 14 system review of systems performed and negative with exception of: Sleepiness. Otherwise negative.  ALLERGIES: No Known Allergies  HOME MEDICATIONS: Outpatient Medications Prior to Visit  Medication Sig Dispense Refill  . levonorgestrel-ethinyl estradiol (LEVORA 0.15/30, 28,) 0.15-30 MG-MCG tablet Take 1 tablet by mouth daily. (Patient not  taking: Reported on 05/12/2016) 84 tablet 3  . cephALEXin (KEFLEX) 500 MG capsule Take 500 mg by mouth 2 (two) times daily.    . hydrocortisone cream 1 % Apply to affected area 2 times daily 30 g 1   No facility-administered medications prior to visit.     PAST MEDICAL HISTORY: Past Medical History:  Diagnosis Date  . HPV (human papilloma virus) infection     PAST SURGICAL HISTORY: Past Surgical History:  Procedure Laterality Date  . CESAREAN SECTION  05/24/2012   Procedure: CESAREAN SECTION;  Surgeon: Antionette Char, MD;  Location: [facility];  Service: Gynecology;  Laterality: N/A;  . INDUCED ABORTION      FAMILY HISTORY: Family History  Problem Relation Age of Onset  . Diabetes Mother   . Diabetes Father   . Anesthesia problems Neg Hx   . Other Neg Hx     SOCIAL HISTORY:  Social History   Social History  . Marital status: Single    Spouse name: N/A  . Number of children: 1  . Years of education: 63   Occupational History  .      self employed   Social History Main Topics  . Smoking status: Light Tobacco Smoker    Types: Cigarettes  . Smokeless tobacco: Never Used     Comment: maybe 2 cigarettes/month  . Alcohol use 0.0 oz/week     Comment: 2-3 times/month  . Drug use: No  . Sexual activity: Yes    Partners: Male    Birth control/ protection: Pill   Other Topics Concern  . Not on file   Social History Narrative  Lives with daughter   Caffeine- occas coffee     PHYSICAL EXAM  GENERAL EXAM/CONSTITUTIONAL: Vitals:  Vitals:   06/20/16 1512  BP: 113/80  Pulse: 65  Weight: 238 lb (108 kg)   Body mass index is 37.28 kg/m. No exam data present  Patient is in no distress; well developed, nourished and groomed; neck is supple  CARDIOVASCULAR:  Examination of carotid arteries is normal; no carotid bruits  Regular rate and rhythm, no murmurs  Examination of peripheral vascular system by observation and palpation is  normal  EYES:  Ophthalmoscopic exam of optic discs and posterior segments is normal; no papilledema or hemorrhages  MUSCULOSKELETAL:  Gait, strength, tone, movements noted in Neurologic exam below  NEUROLOGIC: MENTAL STATUS:  No flowsheet data found.  awake, alert, oriented to person, place and time  recent and remote memory intact  normal attention and concentration  language fluent, comprehension intact, naming intact,   fund of knowledge appropriate  CRANIAL NERVE:   2nd - no papilledema on fundoscopic exam  2nd, 3rd, 4th, 6th - pupils equal and reactive to light, visual fields full to confrontation, extraocular muscles intact, no nystagmus; MILD RIGHT PTOSIS AT REST  5th - facial sensation symmetric  7th - facial strength symmetric  8th - hearing intact  9th - palate elevates symmetrically, uvula midline  11th - shoulder shrug symmetric  12th - tongue protrusion midline  MOTOR:   normal bulk and tone, full strength in the BUE, BLE  SENSORY:   normal and symmetric to light touch, temperature, vibration  COORDINATION:   finger-nose-finger, fine finger movements normal  REFLEXES:   deep tendon reflexes present and symmetric  GAIT/STATION:   narrow based gait; able to walk tandem; romberg is negative    DIAGNOSTIC DATA (LABS, IMAGING, TESTING) - I reviewed patient records, labs, notes, testing and imaging myself where available.  Lab Results  Component Value Date   WBC 6.3 05/12/2016   HGB 9.4 (L) 05/27/2012   HCT 43.2 05/12/2016   MCV 93 05/12/2016   PLT 226 05/12/2016      Component Value Date/Time   NA 139 05/12/2016 0944   K 5.5 (H) 05/12/2016 0944   CL 100 05/12/2016 0944   CO2 24 05/12/2016 0944   GLUCOSE 103 (H) 05/12/2016 0944   BUN 10 05/12/2016 0944   CREATININE 0.63 05/12/2016 0944   CALCIUM 9.8 05/12/2016 0944   PROT 7.3 05/12/2016 0944   ALBUMIN 4.5 05/12/2016 0944   AST 19 05/12/2016 0944   ALT 47 (H) 05/12/2016  0944   ALKPHOS 75 05/12/2016 0944   BILITOT 0.2 05/12/2016 0944   GFRNONAA 119 05/12/2016 0944   GFRAA 137 05/12/2016 0944   No results found for: CHOL, HDL, LDLCALC, LDLDIRECT, TRIG, CHOLHDL Lab Results  Component Value Date   HGBA1C 5.5 05/12/2016   Lab Results  Component Value Date   VITAMINB12 471 05/12/2016   Lab Results  Component Value Date   TSH 1.670 05/12/2016   AChR Binding Ab, Serum  Date Value Ref Range Status  05/12/2016 <0.03 0.00 - 0.24 nmol/L Final    Comment:                                   Negative:   0.00 - 0.24  Borderline: 0.25 - 0.40                                Positive:        > 0.40      ASSESSMENT AND PLAN  35 y.o. year old female here with New onset right ptosis in the setting of right eye irritation, possibly triggered by sleeping with contact lenses in for 3 days, then developing keratoconjunctivitis and preseptal cellulitis of the right eye. Suspect mechanical / infectious cause of right ptosis. Will check additional testing to rule out neurologic etiologies.  Dx:  1. Acquired ptosis of right eyelid   2. Preseptal cellulitis of right eye     PLAN: - additional workup  Orders Placed This Encounter  Procedures  . CMP   Return in about 3 months (around 09/19/2016).     Suanne Marker, MD 06/20/2016, 3:40 PM Certified in Neurology, Neurophysiology and Neuroimaging  [facility] [address] [place], [place] [address] [phone]

## 2016-06-20 NOTE — Patient Instructions (Signed)

## 2016-06-21 LAB — COMPREHENSIVE METABOLIC PANEL
ALBUMIN: 4.5 g/dL (ref 3.5–5.5)
ALT: 46 IU/L — AB (ref 0–32)
AST: 24 IU/L (ref 0–40)
Albumin/Globulin Ratio: 1.6 (ref 1.2–2.2)
Alkaline Phosphatase: 80 IU/L (ref 39–117)
BUN / CREAT RATIO: 18 (ref 9–23)
BUN: 13 mg/dL (ref 6–20)
Bilirubin Total: 0.3 mg/dL (ref 0.0–1.2)
CALCIUM: 9.6 mg/dL (ref 8.7–10.2)
CO2: 23 mmol/L (ref 18–29)
CREATININE: 0.74 mg/dL (ref 0.57–1.00)
Chloride: 100 mmol/L (ref 96–106)
GFR, EST AFRICAN AMERICAN: 123 mL/min/{1.73_m2} (ref >59)
GFR, EST NON AFRICAN AMERICAN: 107 mL/min/{1.73_m2} (ref >59)
GLUCOSE: 94 mg/dL (ref 65–99)
Globulin, Total: 2.9 g/dL (ref 1.5–4.5)
Potassium: 4.3 mmol/L (ref 3.5–5.2)
Sodium: 140 mmol/L (ref 134–144)
TOTAL PROTEIN: 7.4 g/dL (ref 6.0–8.5)

## 2016-07-03 ENCOUNTER — Other Ambulatory Visit: Payer: Medicaid Other

## 2016-07-03 ENCOUNTER — Inpatient Hospital Stay: Admission: RE | Admit: 2016-07-03 | Payer: Medicaid Other | Source: Ambulatory Visit

## 2016-09-15 ENCOUNTER — Ambulatory Visit: Payer: Medicaid Other | Admitting: Diagnostic Neuroimaging

## 2016-09-18 ENCOUNTER — Ambulatory Visit: Payer: Medicaid Other | Admitting: Diagnostic Neuroimaging

## 2016-10-19 ENCOUNTER — Ambulatory Visit: Payer: Self-pay | Admitting: Certified Nurse Midwife

## 2020-11-26 DIAGNOSIS — M67431 Ganglion, right wrist: Secondary | ICD-10-CM | POA: Diagnosis not present

## 2020-12-16 ENCOUNTER — Other Ambulatory Visit (HOSPITAL_COMMUNITY)
Admission: RE | Admit: 2020-12-16 | Discharge: 2020-12-16 | Disposition: A | Discharge status: Home / Self Care | Payer: Medicaid Other | Source: Ambulatory Visit | Attending: Obstetrics | Admitting: Obstetrics

## 2020-12-16 ENCOUNTER — Other Ambulatory Visit: Payer: Self-pay

## 2020-12-16 ENCOUNTER — Ambulatory Visit (INDEPENDENT_AMBULATORY_CARE_PROVIDER_SITE_OTHER): Payer: Medicaid Other | Admitting: Obstetrics

## 2020-12-16 ENCOUNTER — Encounter: Payer: Self-pay | Admitting: Obstetrics

## 2020-12-16 VITALS — BP 117/82 | HR 68 | Ht 67.5 in | Wt 225.0 lb

## 2020-12-16 DIAGNOSIS — E669 Obesity, unspecified: Secondary | ICD-10-CM | POA: Diagnosis not present

## 2020-12-16 DIAGNOSIS — N898 Other specified noninflammatory disorders of vagina: Secondary | ICD-10-CM | POA: Diagnosis not present

## 2020-12-16 DIAGNOSIS — Z01419 Encounter for gynecological examination (general) (routine) without abnormal findings: Secondary | ICD-10-CM | POA: Insufficient documentation

## 2020-12-16 DIAGNOSIS — N644 Mastodynia: Secondary | ICD-10-CM

## 2020-12-16 DIAGNOSIS — Z113 Encounter for screening for infections with a predominantly sexual mode of transmission: Secondary | ICD-10-CM

## 2020-12-16 DIAGNOSIS — Z720 Tobacco use: Secondary | ICD-10-CM

## 2020-12-16 DIAGNOSIS — Z3009 Encounter for other general counseling and advice on contraception: Secondary | ICD-10-CM

## 2020-12-16 DIAGNOSIS — F5101 Primary insomnia: Secondary | ICD-10-CM

## 2020-12-16 DIAGNOSIS — Z0000 Encounter for general adult medical examination without abnormal findings: Secondary | ICD-10-CM

## 2020-12-16 LAB — CERVICOVAGINAL ANCILLARY ONLY
Bacterial Vaginitis (gardnerella): NEGATIVE
Candida Vaginitis: NEGATIVE
Comment: NEGATIVE

## 2020-12-16 MED ORDER — ZOLPIDEM TARTRATE 5 MG PO TABS: 5.0000 mg | ORAL_TABLET | Freq: Every evening | ORAL | 1 refills | Status: AC | PRN

## 2020-12-16 NOTE — Patient Instructions (Signed)
Breast Tenderness Breast tenderness is a common problem for women of all ages, but may also occur in men. Breast tenderness may range from mild discomfort to severe pain. In women, the pain usually comes and goes with the menstrual cycle, but it can also be constant. Breast tenderness has many possible causes, including hormone changes, infections, and taking certain medicines. You may have tests, such as a mammogram or an ultrasound, to check for any unusual findings. Having breast tenderness usually does not mean that you have breast cancer. Follow these instructions at home: Managing pain and discomfort  If directed, put ice to the painful area. To do this: ? Put ice in a plastic bag. ? Place a towel between your skin and the bag. ? Leave the ice on for 20 minutes, 2-3 times a day.  Wear a supportive bra, especially during exercise. You may also want to wear a supportive bra while sleeping if your breasts are very tender.   Medicines  Take over-the-counter and prescription medicines only as told by your health care provider. If the cause of your pain is infection, you may be prescribed an antibiotic medicine.  If you were prescribed an antibiotic, take it as told by your health care provider. Do not stop taking the antibiotic even if you start to feel better. Eating and drinking  Your health care provider may recommend that you lessen the amount of fat in your diet. You can do this by: ? Limiting fried foods. ? Cooking foods using methods such as baking, boiling, grilling, and broiling.  Decrease the amount of caffeine in your diet. Instead, drink more water and choose caffeine-free drinks. General instructions  Keep a log of the days and times when your breasts are most tender.  Ask your health care provider how to do breast exams at home. This will help you notice if you have an unusual growth or lump.  Keep all follow-up visits as told by your health care provider. This is  important.   Contact a health care provider if:  Any part of your breast is hard, red, and hot to the touch. This may be a sign of infection.  You are a woman and: ? Not breastfeeding and you have fluid, especially blood or pus, coming out of your nipples. ? Have a new or painful lump in your breast that remains after your menstrual period ends.  You have a fever.  Your pain does not improve or it gets worse.  Your pain is interfering with your daily activities. Summary  Breast tenderness may range from mild discomfort to severe pain.  Breast tenderness has many possible causes, including hormone changes, infections, and taking certain medicines.  It can be treated with ice, wearing a supportive bra, and medicines.  Make changes to your diet if told to by your health care provider. This information is not intended to replace advice given to you by your health care provider. Make sure you discuss any questions you have with your health care provider. Document Revised: 02/17/2019 Document Reviewed: 02/17/2019 Elsevier Patient Education  2021 [employer] Inc.  Insomnia Insomnia is a sleep disorder that makes it difficult to fall asleep or stay asleep. Insomnia can cause fatigue, low energy, difficulty concentrating, mood swings, and poor performance at work or school. There are three different ways to classify insomnia:  Difficulty falling asleep.  Difficulty staying asleep.  Waking up too early in the morning. Any type of insomnia can be long-term (chronic) or short-term (acute).  Both are common. Short-term insomnia usually lasts for three months or less. Chronic insomnia occurs at least three times a week for longer than three months. What are the causes? Insomnia may be caused by another condition, situation, or substance, such as:  Anxiety.  Certain medicines.  Gastroesophageal reflux disease (GERD) or other gastrointestinal conditions.  Asthma or other breathing  conditions.  Restless legs syndrome, sleep apnea, or other sleep disorders.  Chronic pain.  Menopause.  Stroke.  Abuse of alcohol, tobacco, or illegal drugs.  Mental health conditions, such as depression.  Caffeine.  Neurological disorders, such as Alzheimer's disease.  An overactive thyroid (hyperthyroidism). Sometimes, the cause of insomnia may not be known. What increases the risk? Risk factors for insomnia include:  Gender. Women are affected more often than men.  Age. Insomnia is more common as you get older.  Stress.  Lack of exercise.  Irregular work schedule or working night shifts.  Traveling between different time zones.  Certain medical and mental health conditions. What are the signs or symptoms? If you have insomnia, the main symptom is having trouble falling asleep or having trouble staying asleep. This may lead to other symptoms, such as:  Feeling fatigued or having low energy.  Feeling nervous about going to sleep.  Not feeling rested in the morning.  Having trouble concentrating.  Feeling irritable, anxious, or depressed. How is this diagnosed? This condition may be diagnosed based on:  Your symptoms and medical history. Your health care provider may ask about: ? Your sleep habits. ? Any medical conditions you have. ? Your mental health.  A physical exam. How is this treated? Treatment for insomnia depends on the cause. Treatment may focus on treating an underlying condition that is causing insomnia. Treatment may also include:  Medicines to help you sleep.  Counseling or therapy.  Lifestyle adjustments to help you sleep better. Follow these instructions at home: Eating and drinking  Limit or avoid alcohol, caffeinated beverages, and cigarettes, especially close to bedtime. These can disrupt your sleep.  Do not eat a large meal or eat spicy foods right before bedtime. This can lead to digestive discomfort that can make it hard for  you to sleep.   Sleep habits  Keep a sleep diary to help you and your health care provider figure out what could be causing your insomnia. Write down: ? When you sleep. ? When you wake up during the night. ? How well you sleep. ? How rested you feel the next day. ? Any side effects of medicines you are taking. ? What you eat and drink.  Make your bedroom a dark, comfortable place where it is easy to fall asleep. ? Put up shades or blackout curtains to block light from outside. ? Use a white noise machine to block noise. ? Keep the temperature cool.  Limit screen use before bedtime. This includes: ? Watching TV. ? Using your smartphone, tablet, or computer.  Stick to a routine that includes going to bed and waking up at the same times every day and night. This can help you fall asleep faster. Consider making a quiet activity, such as reading, part of your nighttime routine.  Try to avoid taking naps during the day so that you sleep better at night.  Get out of bed if you are still awake after 15 minutes of trying to sleep. Keep the lights down, but try reading or doing a quiet activity. When you feel sleepy, go back to bed.  General instructions  Take over-the-counter and prescription medicines only as told by your health care provider.  Exercise regularly, as told by your health care provider. Avoid exercise starting several hours before bedtime.  Use relaxation techniques to manage stress. Ask your health care provider to suggest some techniques that may work well for you. These may include: ? Breathing exercises. ? Routines to release muscle tension. ? Visualizing peaceful scenes.  Make sure that you drive carefully. Avoid driving if you feel very sleepy.  Keep all follow-up visits as told by your health care provider. This is important. Contact a health care provider if:  You are tired throughout the day.  You have trouble in your daily routine due to sleepiness.  You  continue to have sleep problems, or your sleep problems get worse. Get help right away if:  You have serious thoughts about hurting yourself or someone else. If you ever feel like you may hurt yourself or others, or have thoughts about taking your own life, get help right away. You can go to your nearest emergency department or call:  Your local emergency services (911 in the [place]  A suicide crisis helpline, such as the National Suicide Prevention Lifeline at 703-866-1607. This is open 24 hours a day. Summary  Insomnia is a sleep disorder that makes it difficult to fall asleep or stay asleep.  Insomnia can be long-term (chronic) or short-term (acute).  Treatment for insomnia depends on the cause. Treatment may focus on treating an underlying condition that is causing insomnia.  Keep a sleep diary to help you and your health care provider figure out what could be causing your insomnia. This information is not intended to replace advice given to you by your health care provider. Make sure you discuss any questions you have with your health care provider. Document Revised: 08/05/2020 Document Reviewed: 08/05/2020 Elsevier Patient Education  2021 [employer] Inc. Diphenhydramine capsules or tablets [Insomnia] What is this medicine? DIPHENHYDRAMINE (dye fen HYE dra meen) is an antihistamine. This medicine is used to treat occasional sleeplesness. This medicine may be used for other purposes; ask your health care provider or pharmacist if you have questions. COMMON BRAND NAME(S): Aid to Sleep, Compoz Nighttime Sleep Aid, Nighttime Sleep Aid, Nytol, Simply Sleep, Sleep Tabs, Sominex, Unisom, Vicks Qlearquil Nighttime Allergy Relief, Vicks ZzzQuil Nightime Sleep-Aid What should I tell my health care provider before I take this medicine? They need to know if you have any of these conditions:  glaucoma  high blood pressure or heart disease  liver disease  lung or breathing disease, like  asthma  pain or trouble passing urine  prostate trouble  ulcers or other stomach problems  an unusual or allergic reaction to diphenhydramine, other medicines foods, dyes, or preservatives such as sulfites  pregnant or trying to get pregnant  breast-feeding How should I use this medicine? Take this medicine by mouth with a full glass of water. Follow the directions on the prescription label. Do not take your medicine more often than directed. Talk to your pediatrician regarding the use of this medicine in children. While this drug may be prescribed for children as young as 45 years old for selected conditions, precautions do apply. Patients over 61 years old may have a stronger reaction and need a smaller dose. Overdosage: If you think you have taken too much of this medicine contact a poison control center or emergency room at once. NOTE: This medicine is only for you. Do not share this medicine with  others. What if I miss a dose? If you miss a dose, take it as soon as you can. If it is almost time for your next dose, take only that dose. Do not take double or extra doses. What may interact with this medicine? Do not take this medicine with any of the following medications:  MAOIs like Carbex, Eldepryl, Marplan, Nardil, and Parnate This medicine may also interact with the following medications:  alcohol  barbiturates like phenobarbital  medicines for bladder spasm like oxybutynin, tolterodine  medicines for blood pressure  medicines for depression, anxiety, or psychotic disturbances  medicines for movement abnormalities or Parkinson's disease  medicines for sleep  other medicines for cold, cough, or allergy  some medicines for the stomach like chlordiazepoxide, dicyclomine This list may not describe all possible interactions. Give your health care provider a list of all the medicines, herbs, non-prescription drugs, or dietary supplements you use. Also tell them if you  smoke, drink alcohol, or use illegal drugs. Some items may interact with your medicine. What should I watch for while using this medicine? Visit your doctor or health care professional for regular check ups. Tell your doctor or health care professional if your symptoms do not start to get better or if they get worse. Your mouth may get dry. Chewing sugarless gum or sucking hard candy, and drinking plenty of water may help. Contact your doctor if the problem does not go away or is severe. This medicine may cause dry eyes and blurred vision. If you wear contact lenses you may feel some discomfort. Lubricating drops may help. See your eye doctor if the problem does not go away or is severe. You may get drowsy or dizzy. Do not drive, use machinery, or do anything that needs mental alertness until you know how this medicine affects you. Do not stand or sit up quickly, especially if you are an older patient. This reduces the risk of dizzy or fainting spells. Alcohol may interfere with the effect of this medicine. Avoid alcoholic drinks. What side effects may I notice from receiving this medicine? Side effects that you should report to your doctor or health care professional as soon as possible:  allergic reactions like skin rash, itching or hives, swelling of the face, lips, or tongue  changes in vision  confused, agitated, or nervous  fast, irregular heartbeat  tremor  trouble passing urine or change in the amount of urine  unusual bleeding or bruising  unusually weak or tired Side effects that usually do not require medical attention (report to your doctor or health care professional if they continue or are bothersome):  constipation, diarrhea  drowsy  headache  loss of appetite  stomach upset, vomiting  thick mucus This list may not describe all possible side effects. Call your doctor for medical advice about side effects. You may report side effects to FDA at 1-800-FDA-1088. Where  should I keep my medicine? Keep out of the reach of children. This medicine can be abused. Keep your medicine in a safe place. Store at room temperature between 20 and 25 degrees C (68 and 77 degrees F). Keep container closed tightly. Throw away any unused medicine after the expiration date. NOTE: This sheet is a summary. It may not cover all possible information. If you have questions about this medicine, talk to your doctor, pharmacist, or health care provider.  2021 Elsevier/Gold Standard (2019-07-04 10:19:49)  Quality Sleep Information, Adult Quality sleep is important for your mental and physical health. It  also improves your quality of life. Quality sleep means you:  Are asleep for most of the time you are in bed.  Fall asleep within 30 minutes.  Wake up no more than once a night.  Are awake for no longer than 20 minutes if you do wake up during the night. Most adults need 7-8 hours of quality sleep each night. How can poor sleep affect me? If you do not get enough quality sleep, you may have:  Mood swings.  Daytime sleepiness.  Confusion.  Decreased reaction time.  Sleep disorders, such as insomnia and sleep apnea.  Difficulty with: ? Solving problems. ? Coping with stress. ? Paying attention. These issues may affect your performance and productivity at work, school, and at home. Lack of sleep may also put you at higher risk for accidents, suicide, and risky behaviors. If you do not get quality sleep you may also be at higher risk for several health problems, including:  Infections.  Type 2 diabetes.  Heart disease.  High blood pressure.  Obesity.  Worsening of long-term conditions, like arthritis, kidney disease, depression, Parkinson's disease, and epilepsy. What actions can I take to get more quality sleep?  Stick to a sleep schedule. Go to sleep and wake up at about the same time each day. Do not try to sleep less on weekdays and make up for lost sleep  on weekends. This does not work.  Try to get about 30 minutes of exercise on most days. Do not exercise 2-3 hours before going to bed.  Limit naps during the day to 30 minutes or less.  Do not use any products that contain nicotine or tobacco, such as cigarettes or e-cigarettes. If you need help quitting, ask your health care provider.  Do not drink caffeinated beverages for at least 8 hours before going to bed. Coffee, tea, and some sodas contain caffeine.  Do not drink alcohol close to bedtime.  Do not eat large meals close to bedtime.  Do not take naps in the late afternoon.  Try to get at least 30 minutes of sunlight every day. Morning sunlight is best.  Make time to relax before bed. Reading, listening to music, or taking a hot bath promotes quality sleep.  Make your bedroom a place that promotes quality sleep. Keep your bedroom dark, quiet, and at a comfortable room temperature. Make sure your bed is comfortable. Take out sleep distractions like TV, a computer, smartphone, and bright lights.  If you are lying awake in bed for longer than 20 minutes, get up and do a relaxing activity until you feel sleepy.  Work with your health care provider to treat medical conditions that may affect sleeping, such as: ? Nasal obstruction. ? Snoring. ? Sleep apnea and other sleep disorders.  Talk to your health care provider if you think any of your prescription medicines may cause you to have difficulty falling or staying asleep.  If you have sleep problems, talk with a sleep consultant. If you think you have a sleep disorder, talk with your health care provider about getting evaluated by a specialist.      Where to find more information  National Sleep Foundation website: https://sleepfoundation.org  National Heart, Lung, and Blood Institute (NHLBI): https://hall.info/.pdf  Centers for Disease Control and Prevention (CDC):  DetailSports.is Contact a health care provider if you:  Have trouble getting to sleep or staying asleep.  Often wake up very early in the morning and cannot get back to sleep.  Have  daytime sleepiness.  Have daytime sleep attacks of suddenly falling asleep and sudden muscle weakness (narcolepsy).  Have a tingling sensation in your legs with a strong urge to move your legs (restless legs syndrome).  Stop breathing briefly during sleep (sleep apnea).  Think you have a sleep disorder or are taking a medicine that is affecting your quality of sleep. Summary  Most adults need 7-8 hours of quality sleep each night.  Getting enough quality sleep is an important part of health and well-being.  Make your bedroom a place that promotes quality sleep and avoid things that may cause you to have poor sleep, such as alcohol, caffeine, smoking, and large meals.  Talk to your health care provider if you have trouble falling asleep or staying asleep. This information is not intended to replace advice given to you by your health care provider. Make sure you discuss any questions you have with your health care provider. Document Revised: 01/02/2018 Document Reviewed: 01/02/2018 Elsevier Patient Education  2021 [employer]

## 2020-12-16 NOTE — Progress Notes (Signed)
Subjective:        [name] [name] is a 40 y.o. female here for a routine exam.  Current complaints: Breast tenderness in left breast and vaginal discharge.  Also has been having difficulty getting to sleep at night, and staying asleep.  Personal health questionnaire:  Is patient Ashkenazi Jewish, have a family history of breast and/or ovarian cancer: yes Is there a family history of uterine cancer diagnosed at age < 53, gastrointestinal cancer, urinary tract cancer, family member who is a Personnel officer syndrome-associated carrier: no Is the patient overweight and hypertensive, family history of diabetes, personal history of gestational diabetes, preeclampsia or PCOS: yes Is patient over 75, have PCOS,  family history of premature CHD under age 36, diabetes, smoke, have hypertension or peripheral artery disease:  no At any time, has a partner hit, kicked or otherwise hurt or frightened you?: no Over the past 2 weeks, have you felt down, depressed or hopeless?: no Over the past 2 weeks, have you felt little interest or pleasure in doing things?:no   Gynecologic History Patient's last menstrual period was 11/18/2020 (approximate). Contraception: none Last Pap: 08-07-2014. Results were: normal Last mammogram: n/a. Results were: n/a  Obstetric History OB History  Gravida Para Term Preterm AB Living  2 1   1 1 2   SAB IAB Ectopic Multiple Live Births    1     1    # Outcome Date GA Lbr Len/2nd Weight Sex Delivery Anes PTL Lv  2 Preterm 05/24/12 [redacted]w[redacted]d / 00:31  F CS-LTranv Spinal  LIV  1 IAB             Past Medical History:  Diagnosis Date  . HPV (human papilloma virus) infection     Past Surgical History:  Procedure Laterality Date  . CESAREAN SECTION  05/24/2012   Procedure: CESAREAN SECTION;  Surgeon: 05/26/2012, MD;  Location: [facility];  Service: Gynecology;  Laterality: N/A;  . INDUCED ABORTION       Current Outpatient Medications:  .  zolpidem (AMBIEN) 5 MG tablet, Take  1 tablet (5 mg total) by mouth at bedtime as needed for sleep., Disp: 15 tablet, Rfl: 1 .  levonorgestrel-ethinyl estradiol (LEVORA 0.15/30, 28,) 0.15-30 MG-MCG tablet, Take 1 tablet by mouth daily. (Patient not taking: No sig reported), Disp: 84 tablet, Rfl: 3 No Known Allergies  Social History   Tobacco Use  . Smoking status: Light Tobacco Smoker    Types: Cigarettes  . Smokeless tobacco: Never Used  . Tobacco comment: maybe 2 cigarettes/month  Substance Use Topics  . Alcohol use: Yes    Alcohol/week: 0.0 standard drinks    Comment: 2-3 times/month    Family History  Problem Relation Age of Onset  . Diabetes Mother   . Diabetes Father   . Anesthesia problems Neg Hx   . Other Neg Hx       Review of Systems  Constitutional: negative for fatigue and weight loss Respiratory: negative for cough and wheezing Cardiovascular: negative for chest pain, fatigue and palpitations Gastrointestinal: negative for abdominal pain and change in bowel habits Musculoskeletal:negative for myalgias Neurological: negative for gait problems and tremors Behavioral/Psych: negative for abusive relationship, depression Endocrine: negative for temperature intolerance    Genitourinary:negative for abnormal menstrual periods, genital lesions, hot flashes, sexual problems.  Positive for vaginal discharge Integument/breast: positive for breast tenderness left breast.  negative for breast lump, nipple discharge and skin lesion(s)    Objective:  BP 117/82   Pulse 68   Ht 5' 7.5" (1.715 m)   Wt 225 lb (102.1 kg)   LMP 11/18/2020 (Approximate)   BMI 34.72 kg/m  General:   alert and no distress  Skin:   no rash or abnormalities  Lungs:   clear to auscultation bilaterally  Heart:   regular rate and rhythm, S1, S2 normal, no murmur, click, rub or gallop  Breasts:   normal without suspicious masses, skin or nipple changes or axillary nodes  Abdomen:  normal findings: no organomegaly, soft,  non-tender and no hernia  Pelvis:  External genitalia: normal general appearance Urinary system: urethral meatus normal and bladder without fullness, nontender Vaginal: normal without tenderness, induration or masses Cervix: normal appearance Adnexa: normal bimanual exam Uterus: anteverted and non-tender, normal size   Lab Review Urine pregnancy test Labs reviewed yes Radiologic studies reviewed no  50% of 20 min visit spent on counseling and coordination of care.   Assessment:     1. Encounter for gynecological examination with Papanicolaou smear of cervix Rx: - Cytology - PAP - Hemoglobin A1c - TSH - Comprehensive metabolic panel - CBC  2. Screening for STD (sexually transmitted disease) Rx: - HIV Antibody (routine testing w rflx) - Hepatitis C antibody - Hepatitis B surface antigen - RPR  3. Vaginal discharge Rx: - Cervicovaginal ancillary only( [place])  4. Obesity (BMI 30.0-34.9) - program of caloric reduction, exercise and behavioral modification recommended  5. Breast tenderness in female Rx: - MM Digital Screening; Future - US BREAST LTD UNI LEFT INC AXILLA; Future  6. Primary insomnia - discussed some of the causes and natural treatments and behavioral changes for insomnia - educational materials dispensed Rx: - zolpidem (AMBIEN) 5 MG tablet; Take 1 tablet (5 mg total) by mouth at bedtime as needed for sleep.  Dispense: 15 tablet; Refill: 1  7. Encounter for counseling regarding contraception - declines contraception - condoms recommended for STD prevention   8. Tobacco abuse - cessation with the aid of medication and behavioral modification recommended  9. Routine adult health maintenance Rx: - Ambulatory referral to Internal Medicine    Plan:    Education reviewed: calcium supplements, depression evaluation, low fat, low cholesterol diet, safe sex/STD prevention, self breast exams, smoking cessation and weight bearing  exercise. Contraception: none. Mammogram ordered. Follow up in: 2 weeks.    Orders Placed This Encounter  Procedures  . US BREAST LTD UNI LEFT INC AXILLA    INSURANCE MEDICAID HEALTHY BLUE Epic ORDER/COSIGNED PF NONE LEFT BREAST PAIN / NO HISTORY BREAST CANCER / NO IMPLANTS / TOMO / NO NEEDS / COVID Q'S - NONE / COVID VACCINES NONE / WEAR MASK AND COME ALONE / SPOKE WITH KIM @ OFFICE / JR     Standing Status:   Future    Standing Expiration Date:   12/16/2021    Order Specific Question:   Reason for Exam (SYMPTOM  OR DIAGNOSIS REQUIRED)    Answer:   Breast tenderness - Left    Order Specific Question:   Preferred imaging location?    Answer:   [facility]  . MM DIAG BREAST TOMO BILATERAL    INSURANCE MEDICAID HEALTHY BLUE Epic ORDER/COSIGNED PF NONE LEFT BREAST PAIN / NO HISTORY BREAST CANCER / NO IMPLANTS / TOMO / NO NEEDS / COVID Q'S - NONE / COVID VACCINES NONE / WEAR MASK AND COME ALONE / SPOKE WITH KIM @ OFFICE / JR     Standing Status:  Future    Standing Expiration Date:   12/16/2021    Order Specific Question:   Reason for Exam (SYMPTOM  OR DIAGNOSIS REQUIRED)    Answer:   Breast tenderness - Left    Order Specific Question:   Is the patient pregnant?    Answer:   No    Order Specific Question:   Preferred imaging location?    Answer:   [facility]  . HIV Antibody (routine testing w rflx)  . Hepatitis C antibody  . Hepatitis B surface antigen  . RPR  . Hemoglobin A1c  . TSH  . Comprehensive metabolic panel  . CBC  . Ambulatory referral to Internal Medicine    Referral Priority:   Routine    Referral Type:   Consultation    Referral Reason:   Specialty Services Required    Requested Specialty:   Internal Medicine    Number of Visits Requested:   1     Brock Bad, MD 12/16/2020 9:46 AM

## 2020-12-16 NOTE — Progress Notes (Signed)
Patient presents for Annual Exam  LMP: 11/18/20 lasting 5 days first 3 days is heavy. STD :Desired  Family Hx of Breast Cancer: M.Aunt Last pap: in [place] 1.5 yrs ago WNL  Contraception: None and none desired.at this time.  CC: pt had concern with one breast left breast pt noticed tenderness. Not feeling discomfort today.  Pt wants to have HGBA1c due to diabetes running on both sides of her family.

## 2020-12-17 LAB — CBC
Hematocrit: 43.2 % (ref 34.0–46.6)
Hemoglobin: 14.5 g/dL (ref 11.1–15.9)
MCH: 29.5 pg (ref 26.6–33.0)
MCHC: 33.6 g/dL (ref 31.5–35.7)
MCV: 88 fL (ref 79–97)
Platelets: 288 10*3/uL (ref 150–450)
RBC: 4.91 x10E6/uL (ref 3.77–5.28)
RDW: 13.1 % (ref 11.7–15.4)
WBC: 8.3 10*3/uL (ref 3.4–10.8)

## 2020-12-17 LAB — COMPREHENSIVE METABOLIC PANEL
ALT: 32 IU/L (ref 0–32)
AST: 17 IU/L (ref 0–40)
Albumin/Globulin Ratio: 1.7 (ref 1.2–2.2)
Albumin: 4.6 g/dL (ref 3.8–4.8)
Alkaline Phosphatase: 73 IU/L (ref 44–121)
BUN/Creatinine Ratio: 15 (ref 9–23)
BUN: 11 mg/dL (ref 6–20)
Bilirubin Total: 0.3 mg/dL (ref 0.0–1.2)
CO2: 22 mmol/L (ref 20–29)
Calcium: 9.4 mg/dL (ref 8.7–10.2)
Chloride: 101 mmol/L (ref 96–106)
Creatinine, Ser: 0.73 mg/dL (ref 0.57–1.00)
Globulin, Total: 2.7 g/dL (ref 1.5–4.5)
Glucose: 112 mg/dL — ABNORMAL HIGH (ref 65–99)
Potassium: 4.5 mmol/L (ref 3.5–5.2)
Sodium: 137 mmol/L (ref 134–144)
Total Protein: 7.3 g/dL (ref 6.0–8.5)
eGFR: 109 mL/min/{1.73_m2} (ref >59)

## 2020-12-17 LAB — CERVICOVAGINAL ANCILLARY ONLY
Candida Glabrata: NEGATIVE
Chlamydia: NEGATIVE
Comment: NEGATIVE
Comment: NEGATIVE
Comment: NEGATIVE
Comment: NEGATIVE
Comment: NORMAL
Neisseria Gonorrhea: NEGATIVE
Trichomonas: NEGATIVE

## 2020-12-17 LAB — RPR: RPR Ser Ql: NONREACTIVE

## 2020-12-17 LAB — TSH: TSH: 2.94 u[IU]/mL (ref 0.450–4.500)

## 2020-12-17 LAB — CYTOLOGY - PAP
Comment: NEGATIVE
Diagnosis: NEGATIVE
High risk HPV: NEGATIVE

## 2020-12-17 LAB — HEPATITIS C ANTIBODY: Hep C Virus Ab: <0.1 s/co ratio (ref 0.0–0.9)

## 2020-12-17 LAB — HEPATITIS B SURFACE ANTIGEN: Hepatitis B Surface Ag: NEGATIVE

## 2020-12-17 LAB — HIV ANTIBODY (ROUTINE TESTING W REFLEX): HIV Screen 4th Generation wRfx: NONREACTIVE

## 2020-12-17 LAB — HEMOGLOBIN A1C
Est. average glucose Bld gHb Est-mCnc: 117 mg/dL
Hgb A1c MFr Bld: 5.7 % — ABNORMAL HIGH (ref 4.8–5.6)

## 2021-01-28 ENCOUNTER — Other Ambulatory Visit: Payer: Self-pay

## 2021-01-28 ENCOUNTER — Ambulatory Visit: Admission: RE | Admit: 2021-01-28 | Payer: Medicaid Other | Source: Ambulatory Visit

## 2021-01-28 ENCOUNTER — Other Ambulatory Visit: Payer: Self-pay | Admitting: Obstetrics

## 2021-01-28 ENCOUNTER — Ambulatory Visit
Admission: RE | Admit: 2021-01-28 | Discharge: 2021-01-28 | Disposition: A | Discharge status: Home / Self Care | Payer: Medicaid Other | Source: Ambulatory Visit | Attending: Obstetrics | Admitting: Obstetrics

## 2021-01-28 DIAGNOSIS — Z1231 Encounter for screening mammogram for malignant neoplasm of breast: Secondary | ICD-10-CM

## 2021-01-28 DIAGNOSIS — N644 Mastodynia: Secondary | ICD-10-CM

## 2021-01-31 ENCOUNTER — Telehealth: Payer: Medicaid Other | Admitting: Obstetrics

## 2021-02-01 ENCOUNTER — Other Ambulatory Visit: Payer: Self-pay | Admitting: Obstetrics

## 2021-02-01 DIAGNOSIS — R928 Other abnormal and inconclusive findings on diagnostic imaging of breast: Secondary | ICD-10-CM

## 2021-02-03 ENCOUNTER — Encounter: Payer: Self-pay | Admitting: Obstetrics

## 2021-02-03 ENCOUNTER — Telehealth (INDEPENDENT_AMBULATORY_CARE_PROVIDER_SITE_OTHER): Payer: Medicaid Other | Admitting: Obstetrics

## 2021-02-03 DIAGNOSIS — N644 Mastodynia: Secondary | ICD-10-CM

## 2021-02-03 NOTE — Progress Notes (Signed)
GYNECOLOGY VIRTUAL VISIT ENCOUNTER NOTE  Provider location: [facility] for [facility]   Patient location: Home  I connected with [name] [name] on 02/03/21 at  3:15 PM EDT by MyChart Video Encounter and verified that I am speaking with the correct person using two identifiers.   I discussed the limitations, risks, security and privacy concerns of performing an evaluation and management service virtually and the availability of in person appointments. I also discussed with the patient that there may be a patient responsible charge related to this service. The patient expressed understanding and agreed to proceed.   History:  [name] [name] is a 40 y.o. [phone] female being evaluated today for results of mammogram for breast tenderness. She denies any abnormal vaginal discharge, bleeding, pelvic pain or other concerns.       Past Medical History:  Diagnosis Date  . HPV (human papilloma virus) infection    Past Surgical History:  Procedure Laterality Date  . CESAREAN SECTION  05/24/2012   Procedure: CESAREAN SECTION;  Surgeon: Antionette Char, MD;  Location: [facility];  Service: Gynecology;  Laterality: N/A;  . INDUCED ABORTION     The following portions of the patient's history were reviewed and updated as appropriate: allergies, current medications, past family history, past medical history, past social history, past surgical history and problem list.   Health Maintenance:  Normal pap and negative HRHPV on 12-16-2020.   Review of Systems:  Pertinent items noted in HPI and remainder of comprehensive ROS otherwise negative.  Physical Exam:   General:  Alert, oriented and cooperative. Patient appears to be in no acute distress.  Mental Status: Normal mood and affect. Normal behavior. Normal judgment and thought content.   Respiratory: Normal respiratory effort, no problems with respiration noted  Rest of physical exam deferred due to type of encounter  Labs and  Imaging No results found for this or any previous visit (from the past 336 hour(s)). MS DIGITAL SCREENING TOMO BILATERAL  Result Date: 01/31/2021 CLINICAL DATA:  Screening. EXAM: DIGITAL SCREENING BILATERAL MAMMOGRAM WITH TOMOSYNTHESIS AND CAD TECHNIQUE: Bilateral screening digital craniocaudal and mediolateral oblique mammograms were obtained. Bilateral screening digital breast tomosynthesis was performed. The images were evaluated with computer-aided detection. COMPARISON:  None. ACR Breast Density Category b: There are scattered areas of fibroglandular density. FINDINGS: In the right breast, a possible asymmetry warrants further evaluation. In the left breast, no findings suspicious for malignancy. IMPRESSION: Further evaluation is suggested for possible asymmetry in the right breast. RECOMMENDATION: Diagnostic mammogram and possibly ultrasound of the right breast. (Code:[id number]) The patient will be contacted regarding the findings, and additional imaging will be scheduled. BI-RADS CATEGORY  0: Incomplete. Need additional imaging evaluation and/or prior mammograms for comparison. Electronically Signed   By: Annia Belt M.D.   On: 01/31/2021 13:05       Assessment and Plan:     1. Breast tenderness in female - mammogram done, and increased density seen in right breast not suspicious for malignancy but diagnostic mammogram and possibly ultrasound recommended.     I discussed the assessment and treatment plan with the patient. The patient was provided an opportunity to ask questions and all were answered. The patient agreed with the plan and demonstrated an understanding of the instructions.   The patient was advised to call back or seek an in-person evaluation/go to the ED if the symptoms worsen or if the condition fails to improve as anticipated.  I have spent a total of  15 minutes of non-face-to-face time, excluding clinical staff time, reviewing notes and preparing to see patient, ordering  tests and/or medications, and counseling the patient.   Coral Ceo, MD Center for [facility], [facility] 02/03/21

## 2021-02-22 ENCOUNTER — Ambulatory Visit
Admission: RE | Admit: 2021-02-22 | Discharge: 2021-02-22 | Disposition: A | Discharge status: Home / Self Care | Payer: Medicaid Other | Source: Ambulatory Visit | Attending: Obstetrics | Admitting: Obstetrics

## 2021-02-22 ENCOUNTER — Ambulatory Visit: Admission: RE | Admit: 2021-02-22 | Payer: Medicaid Other | Source: Ambulatory Visit

## 2021-02-22 ENCOUNTER — Other Ambulatory Visit: Payer: Self-pay

## 2021-02-22 DIAGNOSIS — R928 Other abnormal and inconclusive findings on diagnostic imaging of breast: Secondary | ICD-10-CM

## 2021-06-16 ENCOUNTER — Ambulatory Visit: Payer: Medicaid Other | Admitting: Obstetrics

## 2021-06-16 ENCOUNTER — Encounter: Payer: Self-pay | Admitting: Obstetrics

## 2021-06-16 ENCOUNTER — Other Ambulatory Visit: Payer: Self-pay

## 2021-06-16 VITALS — BP 120/82 | HR 60 | Wt 239.0 lb

## 2021-06-16 DIAGNOSIS — Z131 Encounter for screening for diabetes mellitus: Secondary | ICD-10-CM | POA: Diagnosis not present

## 2021-06-16 DIAGNOSIS — N915 Oligomenorrhea, unspecified: Secondary | ICD-10-CM

## 2021-06-16 LAB — POCT URINE PREGNANCY: Preg Test, Ur: NEGATIVE

## 2021-06-16 NOTE — Progress Notes (Addendum)
Patient ID: [name] [name], female   DOB: 1981/06/26, 40 y.o.   MRN: [id number]  Chief Complaint  Patient presents with   Dysmenorrhea    HPI [name] [name] is a 39 y.o. female.  Complains of no period for 2 months.  Contraception:  None.  Negative subjective signs of pregnancy.  Denies pelvic pain. HPI  Past Medical History:  Diagnosis Date   HPV (human papilloma virus) infection     Past Surgical History:  Procedure Laterality Date   CESAREAN SECTION  05/24/2012   Procedure: CESAREAN SECTION;  Surgeon: Antionette Char, MD;  Location: [facility];  Service: Gynecology;  Laterality: N/A;   INDUCED ABORTION      Family History  Problem Relation Age of Onset   Diabetes Mother    Diabetes Father    Breast cancer Maternal Aunt        in her late 81's   Anesthesia problems Neg Hx    Other Neg Hx     Social History Social History   Tobacco Use   Smoking status: Light Smoker    Types: Cigarettes   Smokeless tobacco: Never   Tobacco comments:    maybe 2 cigarettes/month  Substance Use Topics   Alcohol use: Yes    Alcohol/week: 0.0 standard drinks    Comment: 2-3 times/month   Drug use: No    No Known Allergies  Current Outpatient Medications  Medication Sig Dispense Refill   levonorgestrel-ethinyl estradiol (LEVORA 0.15/30, 28,) 0.15-30 MG-MCG tablet Take 1 tablet by mouth daily. (Patient not taking: No sig reported) 84 tablet 3   zolpidem (AMBIEN) 5 MG tablet Take 1 tablet (5 mg total) by mouth at bedtime as needed for sleep. (Patient not taking: Reported on 06/16/2021) 15 tablet 1   No current facility-administered medications for this visit.    Review of Systems Review of Systems Constitutional: negative for fatigue and weight loss Respiratory: negative for cough and wheezing Cardiovascular: negative for chest pain, fatigue and palpitations Gastrointestinal: negative for abdominal pain and change in bowel habits Genitourinary: positive for no period for 2  months Integument/breast: negative for nipple discharge Musculoskeletal:negative for myalgias Neurological: negative for gait problems and tremors Behavioral/Psych: negative for abusive relationship, depression Endocrine: negative for temperature intolerance      Blood pressure 120/82, pulse 60, weight 239 lb (108.4 kg).  Physical Exam Physical Exam General:   Alert and no distress  Skin:   no rash or abnormalities  Lungs:   clear to auscultation bilaterally  Heart:   regular rate and rhythm, S1, S2 normal, no murmur, click, rub or gallop  Breasts:   normal without suspicious masses, skin or nipple changes or axillary nodes  Abdomen:  normal findings: no organomegaly, soft, non-tender and no hernia  Pelvis:  External genitalia: normal general appearance Urinary system: urethral meatus normal and bladder without fullness, nontender Vaginal: normal without tenderness, induration or masses Cervix: normal appearance Adnexa: normal bimanual exam Uterus: anteverted and non-tender, normal size   I have spent a total of 20 minutes of face-to-face time, excluding clinical staff time, reviewing notes and preparing to see patient, ordering tests and/or medications, and counseling the patient.   Data Reviewed Labs UPT  Assessment     1. Hypomenorrhea/oligomenorrhea Rx: - POCT urine pregnancy - US PELVIC COMPLETE WITH TRANSVAGINAL; Future - CBC - Comprehensive metabolic panel - Ferritin - TSH - Prolactin  2. Screening for diabetes mellitus Rx: - Hemoglobin A1c     Plan   Follow  up in 3 weeks  Orders Placed This Encounter  Procedures   US PELVIC COMPLETE WITH TRANSVAGINAL    Standing Status:   Future    Standing Expiration Date:   06/16/2022    Order Specific Question:   Reason for Exam (SYMPTOM  OR DIAGNOSIS REQUIRED)    Answer:   No period for the past 2 months.    Order Specific Question:   Preferred imaging location?    Answer:   [facility] Ul[facility]ound   CBC   Comprehensive  metabolic panel   Ferritin   TSH   Prolactin   Hemoglobin A1c   POCT urine pregnancy    Assciate with Z32.02 (negative pregnancy test). If positive, switch to Z32.01 (positive pregnancy test)      Brock Bad, MD 06/16/2021 4:29 PM

## 2021-06-17 LAB — CBC
Hematocrit: 40.7 % (ref 34.0–46.6)
Hemoglobin: 14 g/dL (ref 11.1–15.9)
MCH: 30 pg (ref 26.6–33.0)
MCHC: 34.4 g/dL (ref 31.5–35.7)
MCV: 87 fL (ref 79–97)
Platelets: 286 10*3/uL (ref 150–450)
RBC: 4.66 x10E6/uL (ref 3.77–5.28)
RDW: 13.8 % (ref 11.7–15.4)
WBC: 6.3 10*3/uL (ref 3.4–10.8)

## 2021-06-17 LAB — COMPREHENSIVE METABOLIC PANEL
ALT: 45 IU/L — ABNORMAL HIGH (ref 0–32)
AST: 27 IU/L (ref 0–40)
Albumin/Globulin Ratio: 1.5 (ref 1.2–2.2)
Albumin: 4.4 g/dL (ref 3.8–4.8)
Alkaline Phosphatase: 71 IU/L (ref 44–121)
BUN/Creatinine Ratio: 20 (ref 9–23)
BUN: 14 mg/dL (ref 6–20)
Bilirubin Total: 0.3 mg/dL (ref 0.0–1.2)
CO2: 23 mmol/L (ref 20–29)
Calcium: 9.5 mg/dL (ref 8.7–10.2)
Chloride: 99 mmol/L (ref 96–106)
Creatinine, Ser: 0.69 mg/dL (ref 0.57–1.00)
Globulin, Total: 2.9 g/dL (ref 1.5–4.5)
Glucose: 110 mg/dL — ABNORMAL HIGH (ref 65–99)
Potassium: 5 mmol/L (ref 3.5–5.2)
Sodium: 136 mmol/L (ref 134–144)
Total Protein: 7.3 g/dL (ref 6.0–8.5)
eGFR: 114 mL/min/{1.73_m2} (ref >59)

## 2021-06-17 LAB — HEMOGLOBIN A1C
Est. average glucose Bld gHb Est-mCnc: 120 mg/dL
Hgb A1c MFr Bld: 5.8 % — ABNORMAL HIGH (ref 4.8–5.6)

## 2021-06-17 LAB — PROLACTIN: Prolactin: 5.6 ng/mL (ref 4.8–23.3)

## 2021-06-17 LAB — FERRITIN: Ferritin: 78 ng/mL (ref 15–150)

## 2021-06-17 LAB — TSH: TSH: 1.86 u[IU]/mL (ref 0.450–4.500)

## 2021-06-28 ENCOUNTER — Ambulatory Visit: Admission: RE | Admit: 2021-06-28 | Payer: Medicaid Other | Source: Ambulatory Visit

## 2021-07-07 ENCOUNTER — Telehealth: Payer: Medicaid Other | Admitting: Obstetrics

## 2021-07-18 ENCOUNTER — Ambulatory Visit: Admission: RE | Admit: 2021-07-18 | Payer: Medicaid Other | Source: Ambulatory Visit

## 2021-07-18 ENCOUNTER — Telehealth: Payer: Self-pay

## 2021-07-18 NOTE — Telephone Encounter (Signed)
mar/lm advising pt will need to r/s and new appt information.

## 2021-07-20 ENCOUNTER — Other Ambulatory Visit: Payer: Medicaid Other

## 2021-07-21 ENCOUNTER — Ambulatory Visit
Admission: RE | Admit: 2021-07-21 | Discharge: 2021-07-21 | Disposition: A | Discharge status: Home / Self Care | Payer: Medicaid Other | Source: Ambulatory Visit | Attending: Obstetrics | Admitting: Obstetrics

## 2021-07-21 ENCOUNTER — Other Ambulatory Visit: Payer: Self-pay

## 2021-07-21 DIAGNOSIS — N915 Oligomenorrhea, unspecified: Secondary | ICD-10-CM | POA: Diagnosis not present

## 2021-07-26 ENCOUNTER — Encounter: Payer: Self-pay | Admitting: Obstetrics

## 2021-07-26 ENCOUNTER — Telehealth (INDEPENDENT_AMBULATORY_CARE_PROVIDER_SITE_OTHER): Payer: Medicaid Other | Admitting: Obstetrics

## 2021-07-26 DIAGNOSIS — E669 Obesity, unspecified: Secondary | ICD-10-CM | POA: Diagnosis not present

## 2021-07-26 DIAGNOSIS — N926 Irregular menstruation, unspecified: Secondary | ICD-10-CM | POA: Diagnosis not present

## 2021-07-26 DIAGNOSIS — Z3009 Encounter for other general counseling and advice on contraception: Secondary | ICD-10-CM

## 2021-07-26 NOTE — Progress Notes (Signed)
GYNECOLOGY VIRTUAL VISIT ENCOUNTER NOTE  Provider location: [facility] for [facility] at [facility]   Patient location: Home  I connected with RONNIESHA SEIBOLD on 07/26/21 at  3:30 PM EDT by MyChart Video Encounter and verified that I am speaking with the correct person using two identifiers.   I discussed the limitations, risks, security and privacy concerns of performing an evaluation and management service virtually and the availability of in person appointments. I also discussed with the patient that there may be a patient responsible charge related to this service. The patient expressed understanding and agreed to proceed.   History:  [name] [name] is a 40 y.o. [phone] female being evaluated today for follow up after missing periods.  She did have a period this month which was normal.  She denies any abnormal vaginal discharge, bleeding, pelvic pain or other concerns.       Past Medical History:  Diagnosis Date   HPV (human papilloma virus) infection    Past Surgical History:  Procedure Laterality Date   CESAREAN SECTION  05/24/2012   Procedure: CESAREAN SECTION;  Surgeon: Antionette Char, MD;  Location: [facility];  Service: Gynecology;  Laterality: N/A;   INDUCED ABORTION     The following portions of the patient's history were reviewed and updated as appropriate: allergies, current medications, past family history, past medical history, past social history, past surgical history and problem list.   Health Maintenance:  Normal pap and negative HRHPV on 12-16-2020.    Review of Systems:  Pertinent items noted in HPI and remainder of comprehensive ROS otherwise negative.  Physical Exam:   General:  Alert, oriented and cooperative. Patient appears to be in no acute distress.  Mental Status: Normal mood and affect. Normal behavior. Normal judgment and thought content.   Respiratory: Normal respiratory effort, no problems with respiration noted  Rest of physical exam  deferred due to type of encounter  Labs and Imaging  Results for BRENDOLYN, STOCKLEY (MRN [id number]) as of 07/26/2021 15:28  Ref. Range 06/16/2021 15:12  COMPREHENSIVE METABOLIC PANEL Unknown Rpt (A)  Sodium Latest Ref Range: 134 - 144 mmol/L 136  Potassium Latest Ref Range: 3.5 - 5.2 mmol/L 5.0  Chloride Latest Ref Range: 96 - 106 mmol/L 99  CO2 Latest Ref Range: 20 - 29 mmol/L 23  Glucose Latest Ref Range: 65 - 99 mg/dL 169 (H)  BUN Latest Ref Range: 6 - 20 mg/dL 14  Creatinine Latest Ref Range: 0.57 - 1.00 mg/dL 1.90  Calcium Latest Ref Range: 8.7 - 10.2 mg/dL 9.5  BUN/Creatinine Ratio Latest Ref Range: 9 - 23  20  eGFR Latest Ref Range: >59 mL/min/1.73 114  Alkaline Phosphatase Latest Ref Range: 44 - 121 IU/L 71  Albumin Latest Ref Range: 3.8 - 4.8 g/dL 4.4  Albumin/Globulin Ratio Latest Ref Range: 1.2 - 2.2  1.5  AST Latest Ref Range: 0 - 40 IU/L 27  ALT Latest Ref Range: 0 - 32 IU/L 45 (H)  Total Protein Latest Ref Range: 6.0 - 8.5 g/dL 7.3  Total Bilirubin Latest Ref Range: 0.0 - 1.2 mg/dL 0.3  Ferritin Latest Ref Range: 15 - 150 ng/mL 78  Globulin, Total Latest Ref Range: 1.5 - 4.5 g/dL 2.9  WBC Latest Ref Range: 3.4 - 10.8 x10E3/uL 6.3  RBC Latest Ref Range: 3.77 - 5.28 x10E6/uL 4.66  Hemoglobin Latest Ref Range: 11.1 - 15.9 g/dL 78.3  HCT Latest Ref Range: 34.0 - 46.6 % 40.7  MCV Latest Ref Range:  79 - 97 fL 87  MCH Latest Ref Range: 26.6 - 33.0 pg 30.0  MCHC Latest Ref Range: 31.5 - 35.7 g/dL 34.4  RDW Latest Ref Range: 11.7 - 15.4 % 13.8  Platelets Latest Ref Range: 150 - 450 x10E3/uL 286  Prolactin Latest Ref Range: 4.8 - 23.3 ng/mL 5.6  Hemoglobin A1C Latest Ref Range: 4.8 - 5.6 % 5.8 (H)  Est. average glucose Bld gHb Est-mCnc Latest Units: mg/dL 120  TSH Latest Ref Range: 0.450 - 4.500 uIU/mL 1.860    US PELVIC COMPLETE WITH TRANSVAGINAL  Result Date: 07/21/2021 CLINICAL DATA:  Hypermenorrhea, oligomenorrhea EXAM: TRANSABDOMINAL AND TRANSVAGINAL ULTRASOUND OF  PELVIS TECHNIQUE: Both transabdominal and transvaginal ultrasound examinations of the pelvis were performed. Transabdominal technique was performed for global imaging of the pelvis including uterus, ovaries, adnexal regions, and pelvic cul-de-sac. It was necessary to proceed with endovaginal exam following the transabdominal exam to visualize the endometrium and ovaries bilaterally. COMPARISON:  05/23/2012 FINDINGS: Uterus Measurements: 8.2 x 4.3 x 5.6 cm = volume: 102 mL. No fibroids or other mass visualized. The uterus is anteverted. A defect is seen within the lower anterior uterine segment in keeping with prior Caesarean section. The cervix is unremarkable. Endometrium Thickness: 8 mm.  No focal abnormality visualized. Right ovary Measurements: 4.5 x 2.8 x 3.2 cm = volume: 21 mL. Normal appearance/no adnexal mass. Left ovary Measurements: 3.7 x 3.0 x 3.3 cm = volume: 19 mL. Normal appearance/no adnexal mass. Other findings Trace simple appearing free fluid is seen within the cul-de-sac. IMPRESSION: Normal pelvic sonogram. Electronically Signed   By: Fidela Salisbury M.D.   On: 07/21/2021 20:30       Assessment and Plan:     1. Missed periods - probable having anovulatory cycles - OCP's recommended for cycle regulation  2. Obesity (BMI 30.0-34.9) - weight reduction with the aid of dietary changes, exercise and behavioral modification recommended  3. Encounter for counseling regarding contraception - discussed options.  She is considering the patch.       I discussed the assessment and treatment plan with the patient. The patient was provided an opportunity to ask questions and all were answered. The patient agreed with the plan and demonstrated an understanding of the instructions.   The patient was advised to call back or seek an in-person evaluation/go to the ED if the symptoms worsen or if the condition fails to improve as anticipated.  I have spent a total of 15 minutes of face-to-face time,  excluding clinical staff time, reviewing notes and preparing to see patient, ordering tests and/or medications, and counseling the patient.    Baltazar Najjar, MD Center for [facility], [facility]  07/26/21

## 2022-04-26 IMAGING — MG MM DIGITAL SCREENING BILAT W/ TOMO AND CAD
8 series · 8 of 24 positions shown · non-contrast
Comparison: None.

CLINICAL DATA: Screening.

EXAM:
DIGITAL SCREENING BILATERAL MAMMOGRAM WITH TOMOSYNTHESIS AND CAD
TECHNIQUE: Bilateral screening digital craniocaudal and mediolateral oblique
mammograms were obtained. Bilateral screening digital breast
tomosynthesis was performed. The images were evaluated with
computer-aided detection.

[R MLO synth-2D]
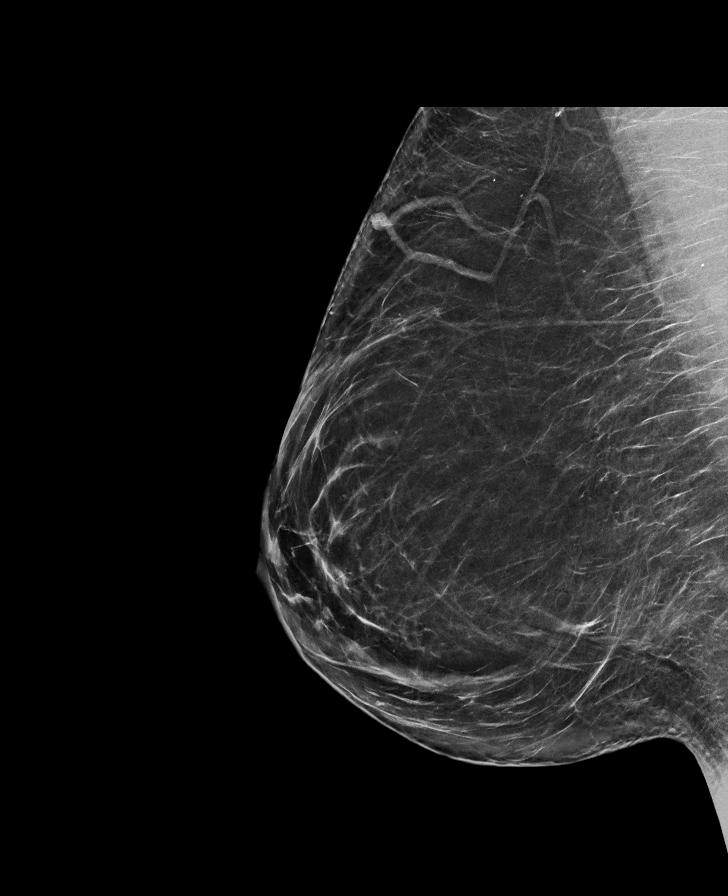

[L MLO synth-2D]
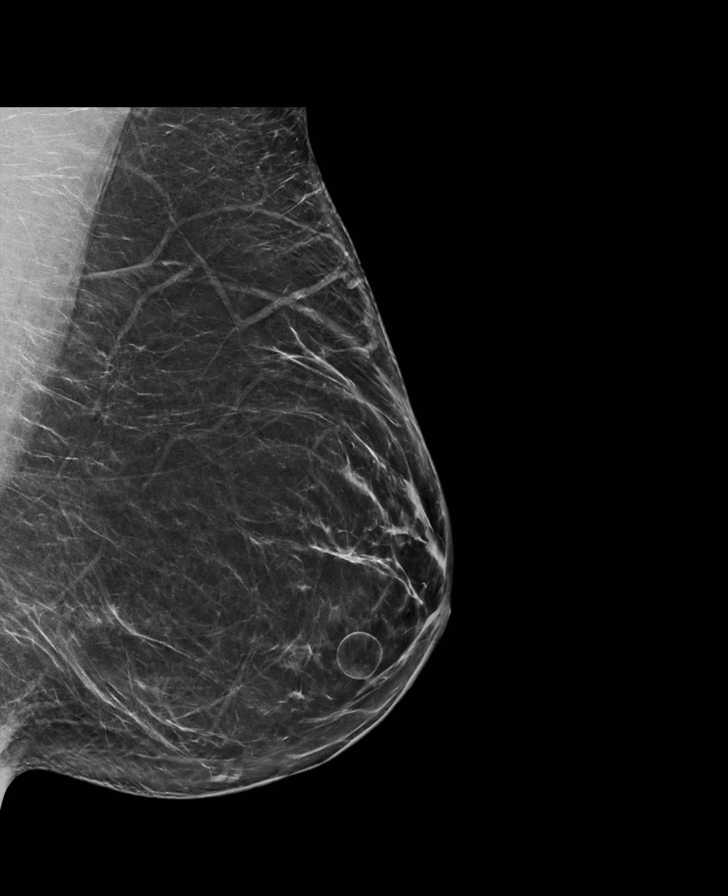

[L CC synth-2D]
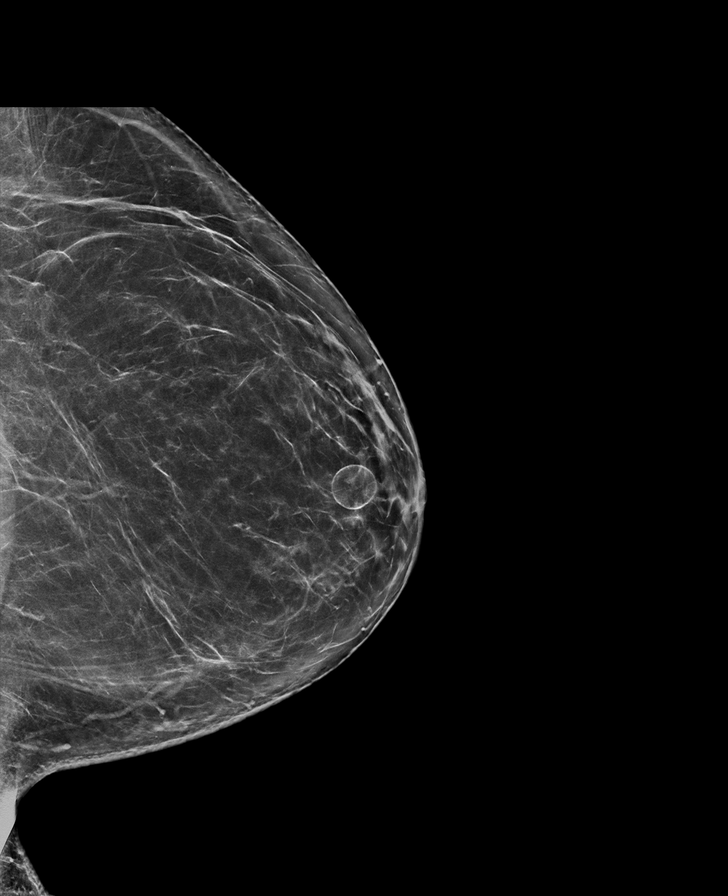

[R CC synth-2D]
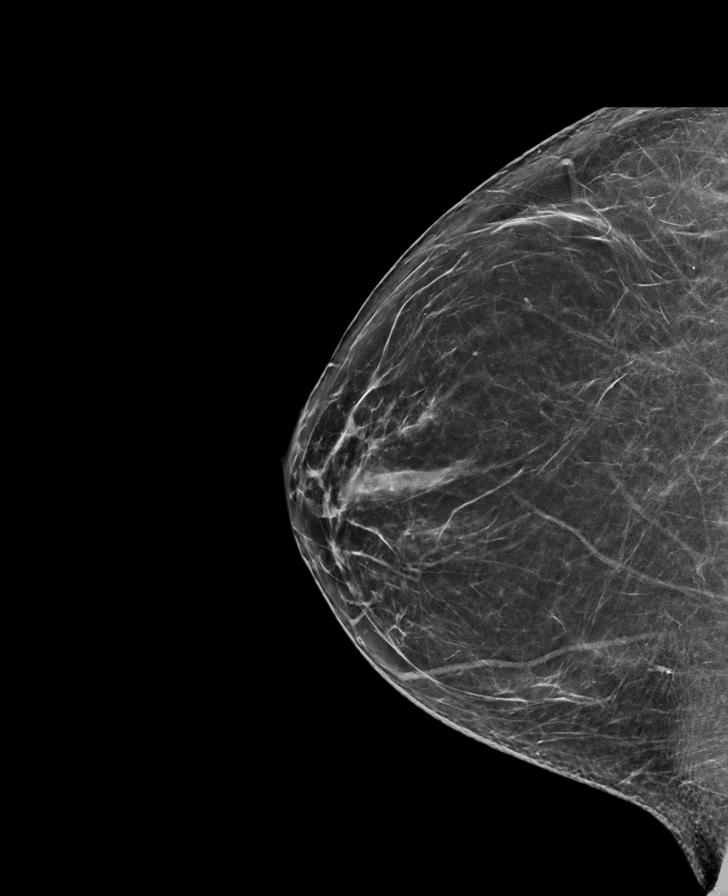

[L CC tomo · tomo slice 41/80.0]
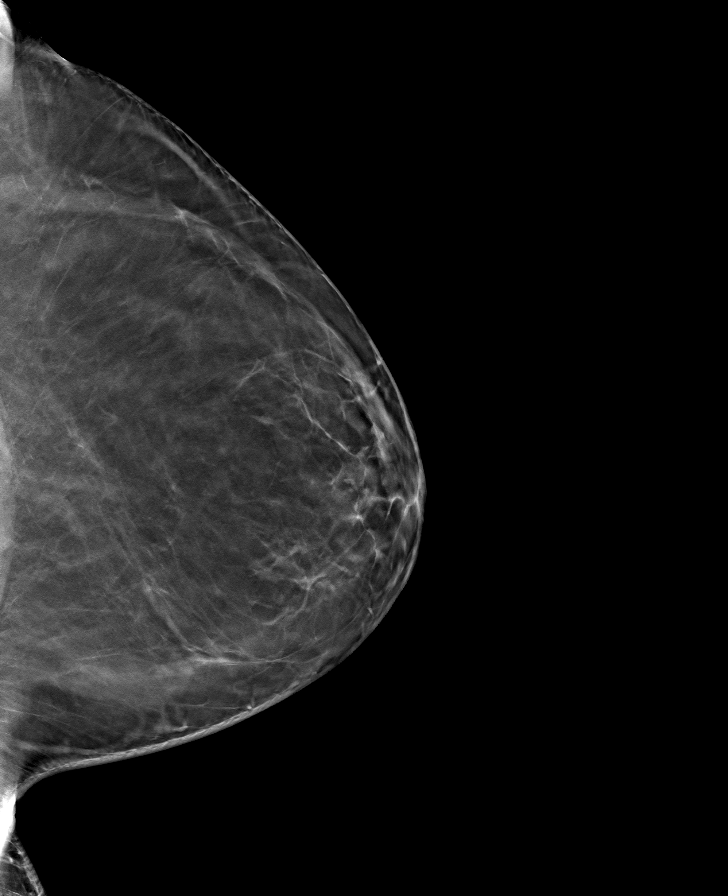

[R CC tomo · tomo slice 37/74.0]
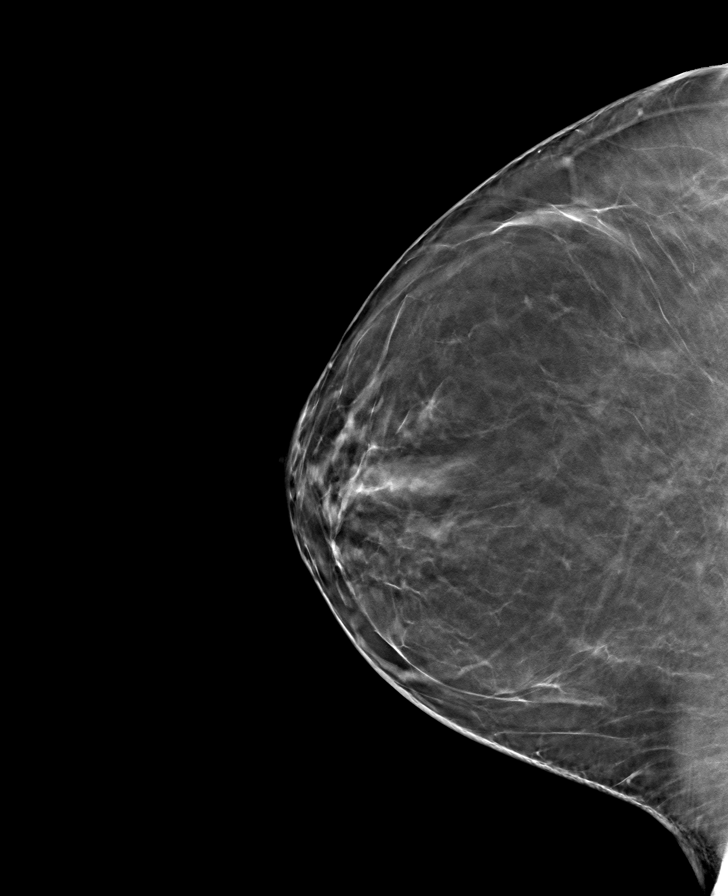

[L MLO tomo · tomo slice 43/84.0]
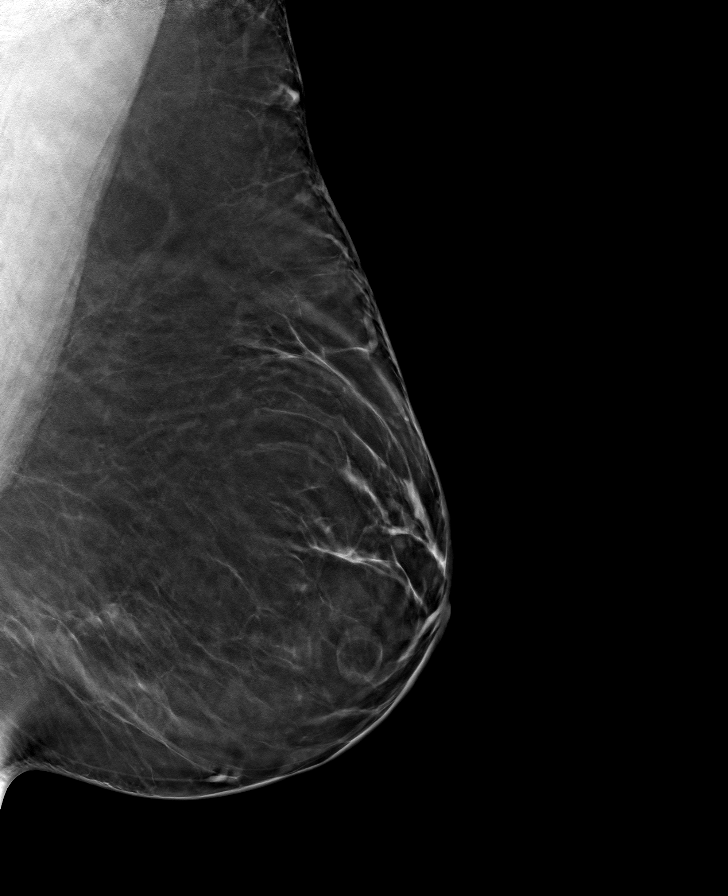

[R MLO tomo · tomo slice 43/84.0]
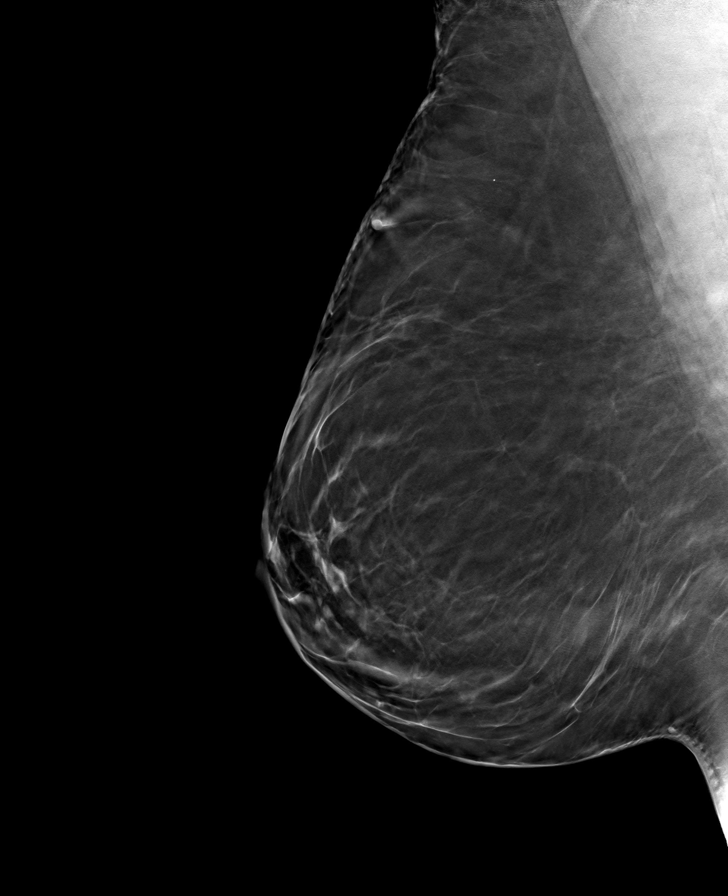

[8 of 24 positions shown; findings below may reference images not displayed]

ACR Breast Density Category b: There are scattered areas of
fibroglandular density.
FINDINGS: In the right breast, a possible asymmetry warrants further
evaluation. In the left breast, no findings suspicious for
malignancy.
IMPRESSION: Further evaluation is suggested for possible asymmetry in the right
breast.

RECOMMENDATION:
Diagnostic mammogram and possibly ultrasound of the right breast.
(Code:[id number])

The patient will be contacted regarding the findings, and additional
imaging will be scheduled.

BI-RADS CATEGORY  0: Incomplete. Need additional imaging evaluation
and/or prior mammograms for comparison.

## 2022-10-04 ENCOUNTER — Ambulatory Visit: Payer: Medicaid Other | Admitting: Obstetrics

## 2022-10-17 IMAGING — US US PELVIS COMPLETE WITH TRANSVAGINAL
1 series · 15 of 25 positions shown · non-contrast
Comparison: 05/23/2012

CLINICAL DATA: Hypermenorrhea, oligomenorrhea

EXAM:
TRANSABDOMINAL AND TRANSVAGINAL ULTRASOUND OF PELVIS
TECHNIQUE: Both transabdominal and transvaginal ultrasound examinations of the
pelvis were performed. Transabdominal technique was performed for
global imaging of the pelvis including uterus, ovaries, adnexal
regions, and pelvic cul-de-sac. It was necessary to proceed with
endovaginal exam following the transabdominal exam to visualize the
endometrium and ovaries bilaterally.

[Series 1: us pelvis complete with transvaginal · 15 of 109 slices shown]
[im 1/109]
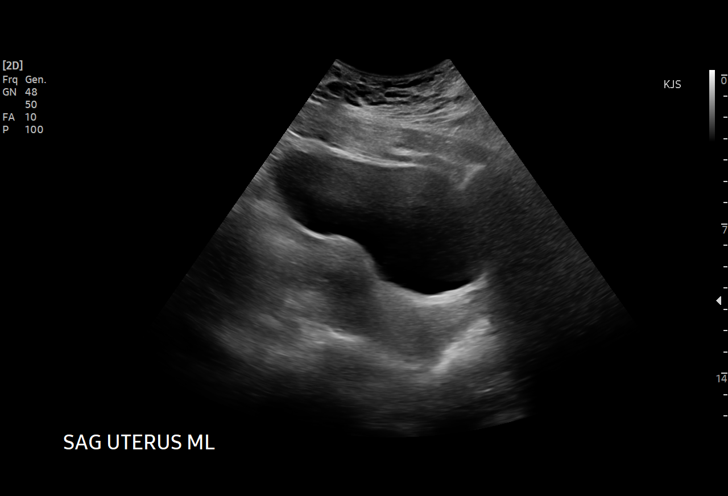
[im 10/109]
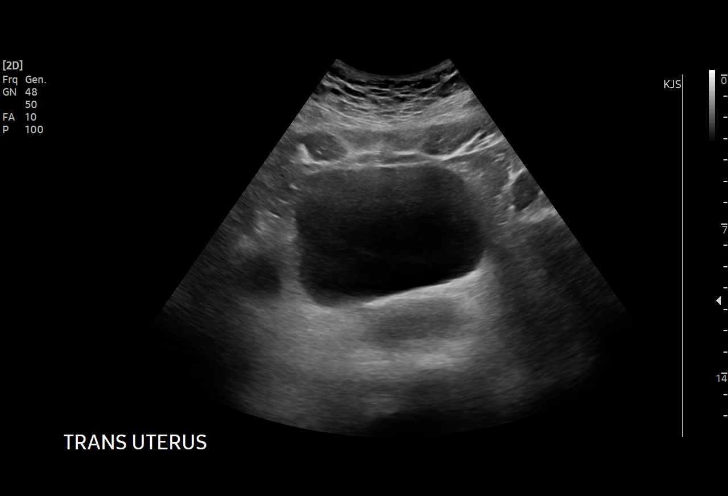
[im 19/109]
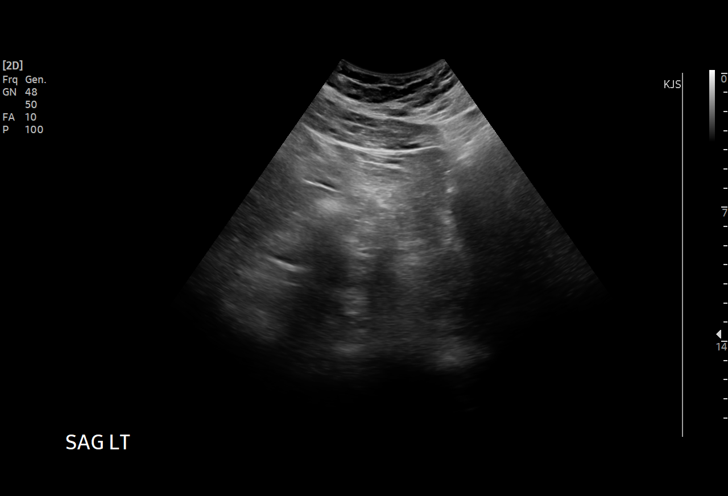
[im 23/109]
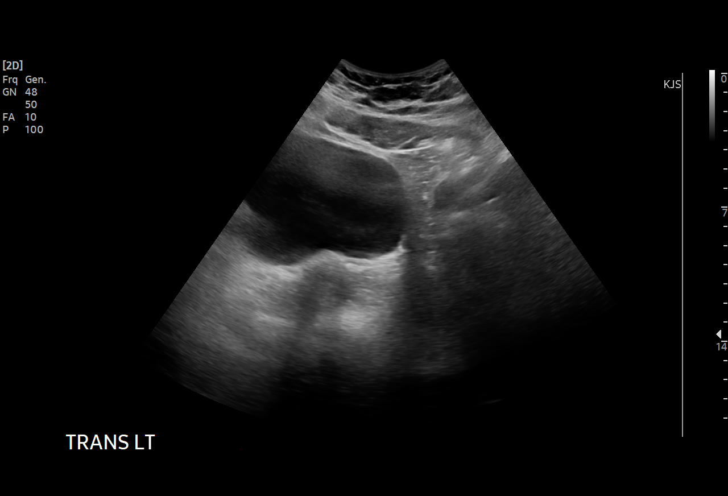
[im 32/109]
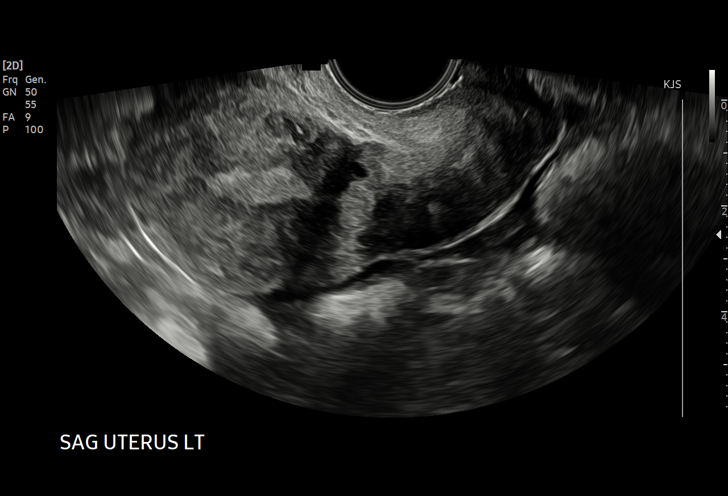
[im 41/109]
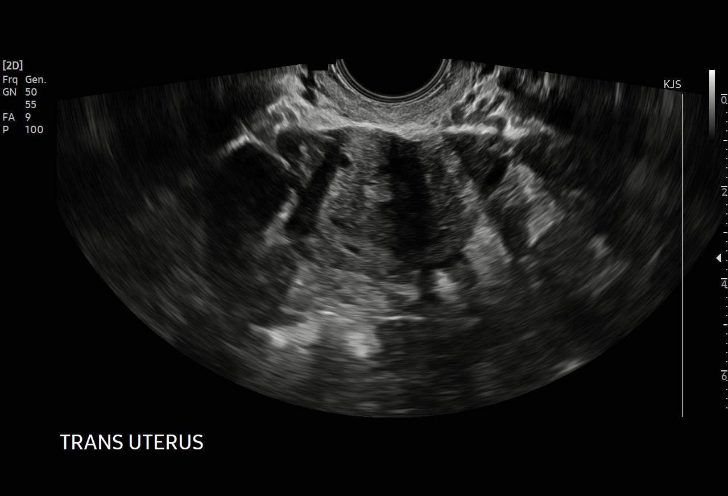
[im 46/109]
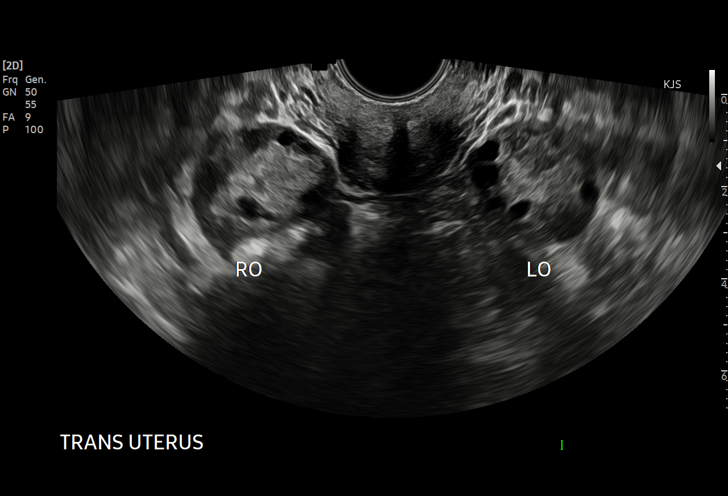
[im 55/109]
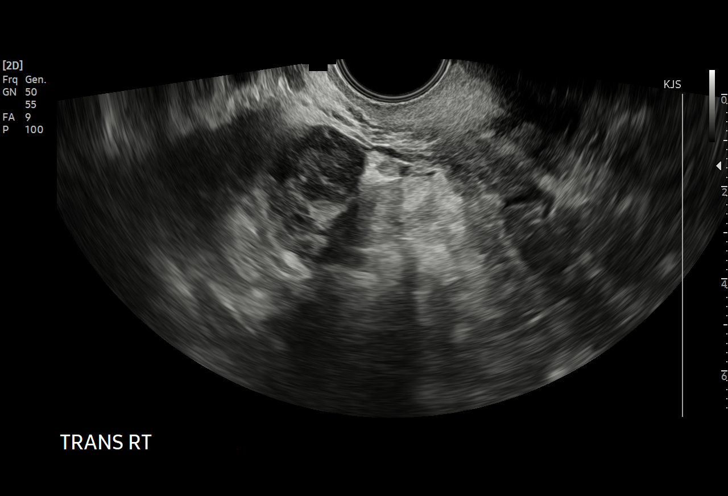
[im 64/109]
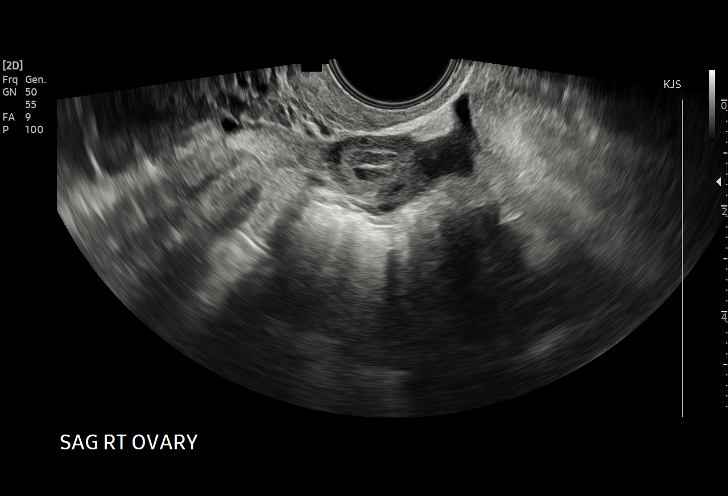
[im 68/109]
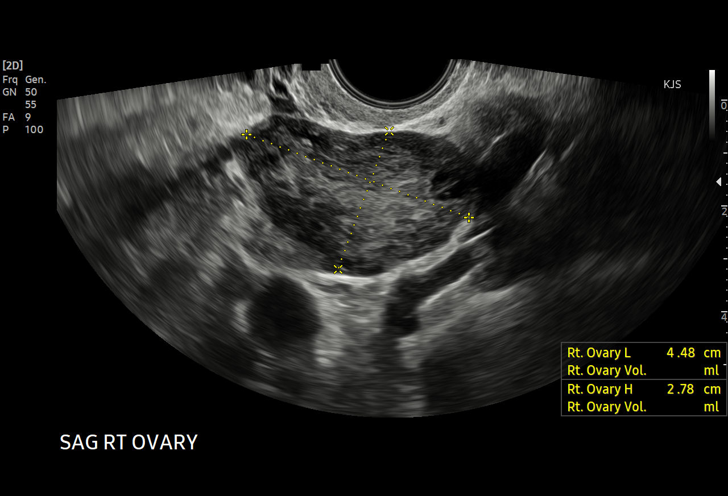
[im 77/109]
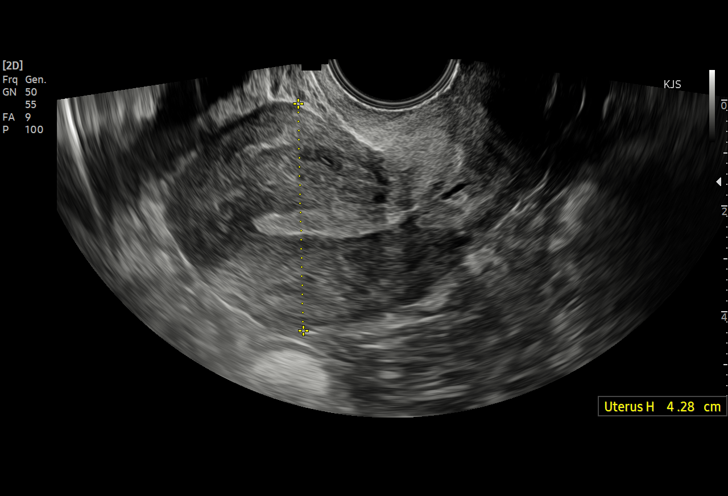
[im 86/109]
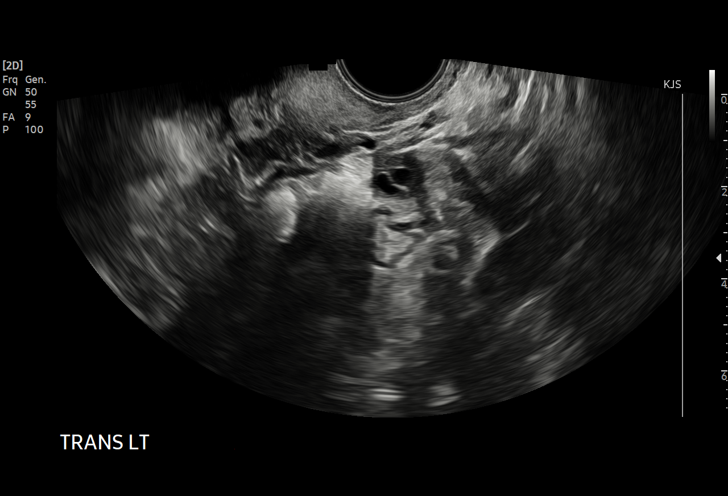
[im 91/109]
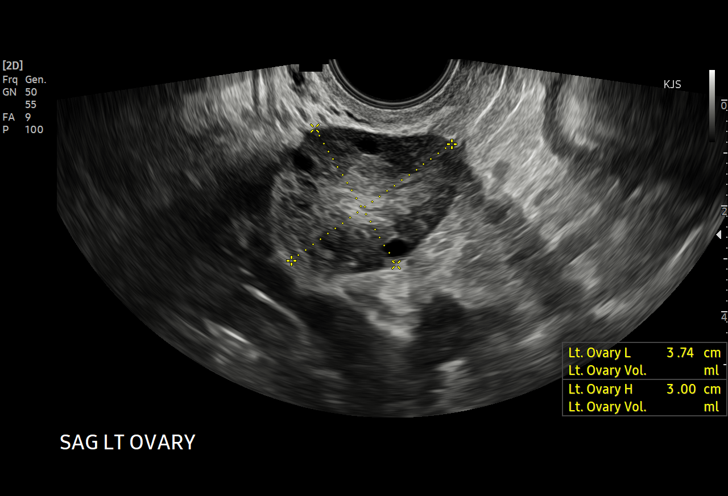
[im 100/109]
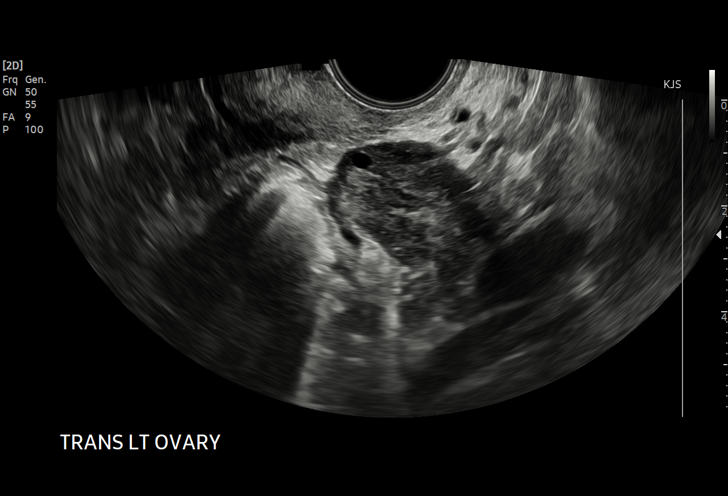
[im 109/109]
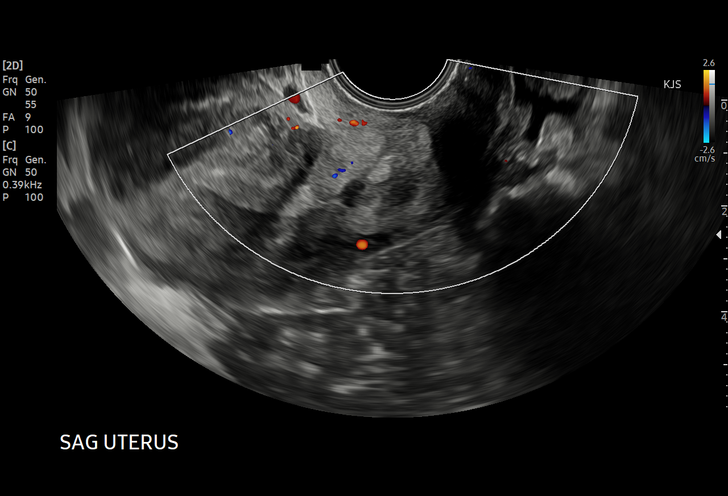

[15 of 25 positions shown; findings below may reference images not displayed]

FINDINGS: Uterus

Measurements: 8.2 x 4.3 x 5.6 cm = volume: 102 mL. No fibroids or
other mass visualized. The uterus is anteverted. A defect is seen
within the lower anterior uterine segment in keeping with prior
Caesarean section. The cervix is unremarkable.

Endometrium

Thickness: 8 mm.  No focal abnormality visualized.

Right ovary

Measurements: 4.5 x 2.8 x 3.2 cm = volume: 21 mL. Normal
appearance/no adnexal mass.

Left ovary

Measurements: 3.7 x 3.0 x 3.3 cm = volume: 19 mL. Normal
appearance/no adnexal mass.

Other findings

Trace simple appearing free fluid is seen within the cul-de-sac.
IMPRESSION: Normal pelvic sonogram.

## 2022-11-06 ENCOUNTER — Other Ambulatory Visit: Payer: Self-pay

## 2022-11-06 ENCOUNTER — Encounter (HOSPITAL_COMMUNITY): Payer: Self-pay | Admitting: *Deleted

## 2022-11-06 ENCOUNTER — Emergency Department (HOSPITAL_COMMUNITY)
Admission: EM | Admit: 2022-11-06 | Discharge: 2022-11-06 | Disposition: A | Discharge status: Home / Self Care | Payer: Medicaid Other | Attending: Emergency Medicine | Admitting: Emergency Medicine

## 2022-11-06 DIAGNOSIS — M545 Low back pain, unspecified: Secondary | ICD-10-CM

## 2022-11-06 LAB — URINALYSIS, ROUTINE W REFLEX MICROSCOPIC
Bilirubin Urine: NEGATIVE
Glucose, UA: NEGATIVE mg/dL
Hgb urine dipstick: NEGATIVE
Ketones, ur: NEGATIVE mg/dL
Leukocytes,Ua: NEGATIVE
Nitrite: NEGATIVE
Protein, ur: NEGATIVE mg/dL
Specific Gravity, Urine: 1.025 (ref 1.005–1.030)
pH: 6 (ref 5.0–8.0)

## 2022-11-06 LAB — PREGNANCY, URINE: Preg Test, Ur: NEGATIVE

## 2022-11-06 MED ORDER — LIDOCAINE 4 % EX PTCH: 1.0000 | MEDICATED_PATCH | CUTANEOUS | 0 refills | Status: AC

## 2022-11-06 MED ORDER — CYCLOBENZAPRINE HCL 10 MG PO TABS: 10.0000 mg | ORAL_TABLET | Freq: Two times a day (BID) | ORAL | 0 refills | Status: AC | PRN

## 2022-11-06 MED ORDER — HYDROCODONE-ACETAMINOPHEN 5-325 MG PO TABS
1.0000 | ORAL_TABLET | Freq: Once | ORAL | Status: AC
  Administered 2022-11-06: 1 via ORAL
  Filled 2022-11-06: qty 1

## 2022-11-06 MED ORDER — HYDROCODONE-ACETAMINOPHEN 5-325 MG PO TABS: 2.0000 | ORAL_TABLET | ORAL | 0 refills | Status: DC | PRN

## 2022-11-06 MED ORDER — LIDOCAINE 5 % EX PTCH
1.0000 | MEDICATED_PATCH | CUTANEOUS | Status: DC
  Administered 2022-11-06: 1 via TRANSDERMAL
  Filled 2022-11-06: qty 1

## 2022-11-06 MED ORDER — KETOROLAC TROMETHAMINE 60 MG/2ML IM SOLN
60.0000 mg | Freq: Once | INTRAMUSCULAR | Status: AC
  Administered 2022-11-06: 60 mg via INTRAMUSCULAR
  Filled 2022-11-06: qty 2

## 2022-11-06 NOTE — ED Triage Notes (Signed)
Pt was lifting a patient Saturday then noticed lower rt back pain radiating to rt lower abd. Has been taking OTC meds

## 2022-11-06 NOTE — ED Provider Notes (Signed)
[facility] Provider Note   CSN: [id number] Arrival date & time: 11/06/22  1529     History  Chief Complaint  Patient presents with   Back Pain    [name] [name] is a 42 y.o. female.   Back Pain    42 year old female with no significant medical history who presents to the emergency department with low back pain.  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  The back pain was sudden in onset and severe.  She had some radiation to her right groin.  No radiation down her leg.  She denies any numbness or weakness.  She denies any fecal incontinence or urinary incontinence.  She denies any increased urinary frequency or dysuria.  No hematuria.  No fevers or chills.  She denies any recent falls or trauma.  She states that she has had persistent back pain since then that has not resolved with over-the-counter Tylenol.  She initially thought she felt a bulge in her right lower abdomen but that has since resolved.  She had 2 episodes of vomiting after the injury but denies any further episodes of vomiting.  She is tolerating oral intake.  Home Medications Prior to Admission medications   Medication Sig Start Date End Date Taking? Authorizing Provider  cyclobenzaprine (FLEXERIL) 10 MG tablet Take 1 tablet (10 mg total) by mouth 2 (two) times daily as needed for muscle spasms. 11/06/22  Yes Regan Lemming, MD  HYDROcodone-acetaminophen (NORCO/VICODIN) 5-325 MG tablet Take 2 tablets by mouth every 4 (four) hours as needed. 11/06/22  Yes Regan Lemming, MD  lidocaine 4 % Place 1 patch onto the skin daily for 10 days. 11/06/22 11/16/22 Yes Regan Lemming, MD  levonorgestrel-ethinyl estradiol (LEVORA 0.15/30, 28,) 0.15-30 MG-MCG tablet Take 1 tablet by mouth daily. Patient not taking: No sig reported 10/19/15   Kandis Cocking A, CNM  zolpidem (AMBIEN) 5 MG tablet Take 1 tablet (5  mg total) by mouth at bedtime as needed for sleep. Patient not taking: Reported on 06/16/2021 12/16/20   Shelly Bombard, MD      Allergies    Patient has no known allergies.    Review of Systems   Review of Systems  Gastrointestinal:  Positive for vomiting.  Musculoskeletal:  Positive for back pain.  All other systems reviewed and are negative.   Physical Exam Updated Vital Signs BP 134/85   Pulse 61   Temp 98.4 F (36.9 C)   Resp 17   Ht 5\' 7"  (1.702 m)   Wt 108 kg   SpO2 99%   BMI 37.28 kg/m  Physical Exam Vitals and nursing note reviewed.  Constitutional:      General: She is not in acute distress.    Appearance: She is well-developed.  HENT:     Head: Normocephalic and atraumatic.  Eyes:     Conjunctiva/sclera: Conjunctivae normal.  Cardiovascular:     Rate and Rhythm: Normal rate and regular rhythm.  Pulmonary:     Effort: Pulmonary effort is normal. No respiratory distress.     Breath sounds: Normal breath sounds.  Abdominal:     Palpations: Abdomen is soft.     Tenderness: There is no abdominal tenderness. There is no right CVA tenderness, left CVA tenderness, guarding or rebound.     Comments: Abdomen is soft, non-tender, non-distended, no rebound or guarding. No palpable masses  Musculoskeletal:        General: No swelling.     Cervical back: Neck supple.     Comments: No midline tenderness to palpation of the cervical, thoracic or lumbar spine.  Negative straight leg raise test bilaterally.  Skin:    General: Skin is warm and dry.     Capillary Refill: Capillary refill takes less than 2 seconds.  Neurological:     Mental Status: She is alert.     Comments: 5/5 strength in the bilateral upper and lower extremities. No sensory deficit.   Psychiatric:        Mood and Affect: Mood normal.     ED Results / Procedures / Treatments   Labs (all labs ordered are listed, but only abnormal results are displayed) Labs Reviewed  PREGNANCY, URINE   URINALYSIS, ROUTINE W REFLEX MICROSCOPIC    EKG None  Radiology No results found.  Procedures Procedures    Medications Ordered in ED Medications  lidocaine (LIDODERM) 5 % 1 patch (1 patch Transdermal Patch Applied 11/06/22 1843)  HYDROcodone-acetaminophen (NORCO/VICODIN) 5-325 MG per tablet 1 tablet (1 tablet Oral Given 11/06/22 1845)  ketorolac (TORADOL) injection 60 mg (60 mg Intramuscular Given 11/06/22 1845)    ED Course/ Medical Decision Making/ A&P                             Medical Decision Making Amount and/or Complexity of Data Reviewed Labs: ordered.  Risk OTC drugs. Prescription drug management.     42 year old female with no significant medical history who presents to the emergency department with low back pain.  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  The back pain was sudden in onset and severe.  She had some radiation to her right groin.  No radiation down her leg.  She denies any numbness or weakness.  She denies any fecal incontinence or urinary incontinence.  She denies any increased urinary frequency or dysuria.  No hematuria.  No fevers or chills.  She denies any recent falls or trauma.  She states that she has had persistent back pain since then that has not resolved with over-the-counter Tylenol.  She initially thought she felt a bulge in her right lower abdomen but that has since resolved.  She had 2 episodes of vomiting after the injury but denies any further episodes of vomiting.  She is tolerating oral intake.  The patient is able to ambulate and is hemodynamically stable. There are no red flag symptoms. Specifically, she denies: -Having saddle anesthesia -Having urinary or fecal incontinence -Having any recent falls or trauma  Differential Diagnoses: I do not think that Sindy Messing is experiencing cauda equina syndrome, abdominal aortic aneurysm, epidural  abscess, aortic dissection, spinal hematoma, nephrolithiasis, spinal metastasis, discitis, or an acute fracture.  The patient is able to ambulate. She has no midline tenderness of her spine and is neurologically intact. Her abdominal exam is reassuring with a soft, nontender, nondistended abdomen, no rebound or guarding with no masses appreciated.  While in the ED, I provided the patient with: Medications  lidocaine (LIDODERM) 5 % 1 patch (1 patch Transdermal Patch Applied 11/06/22 1843)  HYDROcodone-acetaminophen (NORCO/VICODIN) 5-325 MG per tablet 1 tablet (1 tablet Oral Given 11/06/22 1845)  ketorolac (TORADOL) injection 60 mg (60 mg Intramuscular Given 11/06/22 1845)    On reassessment, the patient was  stable and with improvement after the above interventions. This presentation is most consistent with acute low back pain with lumbosacral radiculopathy vs degenerative disc disease/disc herniation.  Given the patient's reassuring presentation, I believe that she is safe for discharge.  I provided ED return precautions, specifically for the symptoms which are most concerning (e.g., saddle anesthesia, urinary or bowel incontinence or retention, changing or worsening pain), which would necessitate immediate return.  I encouraged the patient to followup with their PCP.   Final Clinical Impression(s) / ED Diagnoses Final diagnoses:  Acute midline low back pain without sciatica    Rx / DC Orders ED Discharge Orders          Ordered    HYDROcodone-acetaminophen (NORCO/VICODIN) 5-325 MG tablet  Every 4 hours PRN        11/06/22 1949    cyclobenzaprine (FLEXERIL) 10 MG tablet  2 times daily PRN        11/06/22 1949    lidocaine 4 %  Every 24 hours        11/06/22 1950              Regan Lemming, MD 11/06/22 1951

## 2022-11-06 NOTE — Discharge Instructions (Addendum)
Recommend short course of opiates, NSAIDs, lidocaine patch, Flexeril for your acute low back pain.  Return for any severe worsening in the setting of new onset fever, new fall, new weakness or numbness in your extremities or around your groin, urinary or fecal incontinence.  Return for any severe abdominal pain, a bulge in your groin or abdomen that is significantly tender to palpation with associated vomiting.

## 2022-11-06 NOTE — ED Notes (Signed)
Pt aware of the need for a urine sample. Says she may be ready in about 10 minutes or so.

## 2022-11-06 NOTE — ED Provider Triage Note (Signed)
Emergency Medicine Provider Triage Evaluation Note  SWAY GUTTIERREZ , a 42 y.o. female  was evaluated in triage.  Pt complains of right-sided lower back pain without radiation starting 2 days ago after lifting a patient.  Pain is worse with palpation and movement.  Pain was severe and radiated to the right lower quadrant of the abdomen and she felt bloating.  She did have 2 episodes of vomiting after the injury.  The pain does not radiate into her leg.  Denies lower extremity weakness.  No urinary symptoms.  Review of Systems  Positive: Back pain Negative: Loss of bowel or bladder control, leg pain  Physical Exam  BP 134/85   Pulse 61   Temp 98.4 F (36.9 C)   Resp 17   Ht 5\' 7"  (1.702 m)   Wt 108 kg   SpO2 99%   BMI 37.28 kg/m  Gen:   Awake, no distress   Resp:  Normal effort  MSK:   Moves extremities without difficulty  Other:  Tenderness to palpation of the lower right paraspinous musculature, abdomen nontender  Medical Decision Making  Medically screening exam initiated at 3:58 PM.  Appropriate orders placed.  ONESTI BONFIGLIO was informed that the remainder of the evaluation will be completed by another provider, this initial triage assessment does not replace that evaluation, and the importance of remaining in the ED until their evaluation is complete.     Carlisle Cater, PA-C 11/06/22 1559

## 2022-11-21 ENCOUNTER — Ambulatory Visit: Payer: Medicaid Other | Admitting: Obstetrics

## 2022-12-05 ENCOUNTER — Ambulatory Visit: Payer: Medicaid Other | Admitting: Obstetrics

## 2022-12-13 ENCOUNTER — Encounter: Payer: Self-pay | Admitting: Family Medicine

## 2022-12-13 ENCOUNTER — Other Ambulatory Visit (HOSPITAL_COMMUNITY)
Admission: RE | Admit: 2022-12-13 | Discharge: 2022-12-13 | Disposition: A | Discharge status: Home / Self Care | Payer: Medicaid Other | Source: Ambulatory Visit | Attending: Obstetrics | Admitting: Obstetrics

## 2022-12-13 ENCOUNTER — Ambulatory Visit (INDEPENDENT_AMBULATORY_CARE_PROVIDER_SITE_OTHER): Payer: Medicaid Other | Admitting: Family Medicine

## 2022-12-13 VITALS — BP 121/76 | HR 59 | Ht 67.0 in | Wt 250.2 lb

## 2022-12-13 DIAGNOSIS — Z01419 Encounter for gynecological examination (general) (routine) without abnormal findings: Secondary | ICD-10-CM | POA: Diagnosis not present

## 2022-12-13 DIAGNOSIS — Z124 Encounter for screening for malignant neoplasm of cervix: Secondary | ICD-10-CM | POA: Insufficient documentation

## 2022-12-13 DIAGNOSIS — Z113 Encounter for screening for infections with a predominantly sexual mode of transmission: Secondary | ICD-10-CM

## 2022-12-13 DIAGNOSIS — Z1159 Encounter for screening for other viral diseases: Secondary | ICD-10-CM

## 2022-12-13 MED ORDER — PRENATAL 6.75-0.2 MG PO TABS: 1.0000 | ORAL_TABLET | Freq: Every day | ORAL | 3 refills | Status: AC

## 2022-12-13 NOTE — Progress Notes (Unsigned)
Pt presents for AEX. Last PAP 12/2020. Pt requesting PAP, STD testing and hormonal labs today.  Pt desire conception in the next year. Would like Rx for prenatal vitamins.

## 2022-12-14 DIAGNOSIS — Z113 Encounter for screening for infections with a predominantly sexual mode of transmission: Secondary | ICD-10-CM | POA: Insufficient documentation

## 2022-12-14 DIAGNOSIS — Z124 Encounter for screening for malignant neoplasm of cervix: Secondary | ICD-10-CM | POA: Insufficient documentation

## 2022-12-14 LAB — HEPATITIS B SURFACE ANTIGEN: Hepatitis B Surface Ag: NEGATIVE

## 2022-12-14 LAB — HIV ANTIBODY (ROUTINE TESTING W REFLEX): HIV Screen 4th Generation wRfx: NONREACTIVE

## 2022-12-14 LAB — HEPATITIS C ANTIBODY: Hep C Virus Ab: NONREACTIVE

## 2022-12-14 LAB — RPR: RPR Ser Ql: NONREACTIVE

## 2022-12-14 NOTE — Assessment & Plan Note (Signed)
Swabs and blood test collected today.  Will call patient with results of any abnormalities or send MyChart message if everything within normal limits.

## 2022-12-14 NOTE — Progress Notes (Signed)
    SUBJECTIVE:   Chief compliant/HPI: annual examination  [name] [name] is a 42 y.o. who presents today for an annual exam.   Review of systems form notable for normal periods.  No concerns at this time.   Updated history tabs and problem list.   OBJECTIVE:   BP 121/76   Pulse (!) 59   Ht '5\' 7"'$  (1.702 m)   Wt 250 lb 3.2 oz (113.5 kg)   LMP 12/04/2022   BMI 39.19 kg/m   General: Pleasant well-appearing 42 year old female in no acute distress Cardiac: Regular rate Respiratory: Normal work of breathing, speaking in full sentences Abdomen: Soft, nontender, positive bowel sounds GU: Normal external female genitalia, normal internal vaginal tissue, cervix nonfriable nonirritated with no excessive discharge MSK: No gross abnormality  ASSESSMENT/PLAN:   No problem-specific Assessment & Plan notes found for this encounter.    Annual Examination  See AVS for age appropriate recommendations.   Blood pressure reviewed and at goal.  The patient currently uses nothing for contraception. Folate recommended as appropriate, minimum of 400 mcg per day.         Considered the following items based upon USPSTF recommendations: HIV testing: ordered Hepatitis C: ordered Hepatitis B: ordered Syphilis if at high risk: ordered GC/CT  collected today Cervical cancer screening: due for Pap today, cytology + HPV ordered  Follow up in 1   year or sooner if indicated.    Concepcion Living, MD OB fellow

## 2022-12-14 NOTE — Assessment & Plan Note (Signed)
Pap smear collected today.

## 2022-12-15 LAB — CERVICOVAGINAL ANCILLARY ONLY
Chlamydia: NEGATIVE
Comment: NEGATIVE
Comment: NEGATIVE
Comment: NORMAL
Neisseria Gonorrhea: NEGATIVE
Trichomonas: NEGATIVE

## 2022-12-21 LAB — CYTOLOGY - PAP
Comment: NEGATIVE
Diagnosis: NEGATIVE
High risk HPV: NEGATIVE
